# Patient Record
Sex: Female | Born: 1940 | Hispanic: Yes | Marital: Married | Smoking: Never smoker
Health system: Southern US, Community
[De-identification: ages and names within clinical notes are randomized; demographics above are authoritative.]

## PROBLEM LIST (undated history)

## (undated) DIAGNOSIS — E785 Hyperlipidemia, unspecified: Secondary | ICD-10-CM

## (undated) DIAGNOSIS — M810 Age-related osteoporosis without current pathological fracture: Secondary | ICD-10-CM

## (undated) DIAGNOSIS — I10 Essential (primary) hypertension: Secondary | ICD-10-CM

## (undated) DIAGNOSIS — E13319 Other specified diabetes mellitus with unspecified diabetic retinopathy without macular edema: Secondary | ICD-10-CM

## (undated) DIAGNOSIS — C801 Malignant (primary) neoplasm, unspecified: Secondary | ICD-10-CM

## (undated) HISTORY — DX: Hyperlipidemia, unspecified: E78.5

## (undated) HISTORY — DX: Other specified diabetes mellitus with unspecified diabetic retinopathy without macular edema: E13.319

## (undated) HISTORY — PX: BREAST LUMPECTOMY: SHX2

## (undated) HISTORY — PX: FRACTURE SURGERY: SHX138

## (undated) HISTORY — PX: BREAST SURGERY: SHX581

## (undated) HISTORY — PX: CYSTECTOMY: SUR359

## (undated) HISTORY — DX: Essential (primary) hypertension: I10

## (undated) HISTORY — DX: Malignant (primary) neoplasm, unspecified: C80.1

## (undated) HISTORY — DX: Age-related osteoporosis without current pathological fracture: M81.0

---

## 2007-11-06 ENCOUNTER — Ambulatory Visit: Payer: Self-pay | Admitting: Oncology

## 2007-11-19 ENCOUNTER — Encounter: Admission: RE | Admit: 2007-11-19 | Discharge: 2007-11-19 | Payer: Self-pay | Admitting: Family Medicine

## 2007-11-20 LAB — CBC WITH DIFFERENTIAL/PLATELET
BASO%: 0.5 % (ref 0.0–2.0)
Eosinophils Absolute: 0.1 10*3/uL (ref 0.0–0.5)
LYMPH%: 30.5 % (ref 14.0–48.0)
MCHC: 34.5 g/dL (ref 32.0–36.0)
MCV: 94.9 fL (ref 81.0–101.0)
MONO%: 5.4 % (ref 0.0–13.0)
Platelets: 223 10*3/uL (ref 145–400)
RBC: 4.03 10*6/uL (ref 3.70–5.32)

## 2007-11-20 LAB — COMPREHENSIVE METABOLIC PANEL
Alkaline Phosphatase: 70 U/L (ref 39–117)
Creatinine, Ser: 0.94 mg/dL (ref 0.40–1.20)
Glucose, Bld: 204 mg/dL — ABNORMAL HIGH (ref 70–99)
Sodium: 138 mEq/L (ref 135–145)
Total Bilirubin: 0.4 mg/dL (ref 0.3–1.2)
Total Protein: 7.8 g/dL (ref 6.0–8.3)

## 2007-12-05 ENCOUNTER — Encounter: Admission: RE | Admit: 2007-12-05 | Discharge: 2007-12-05 | Payer: Self-pay | Admitting: Oncology

## 2007-12-16 ENCOUNTER — Encounter: Admission: RE | Admit: 2007-12-16 | Discharge: 2007-12-16 | Payer: Self-pay | Admitting: General Surgery

## 2007-12-17 ENCOUNTER — Encounter: Admission: RE | Admit: 2007-12-17 | Discharge: 2007-12-17 | Payer: Self-pay | Admitting: General Surgery

## 2007-12-17 ENCOUNTER — Other Ambulatory Visit: Admission: RE | Admit: 2007-12-17 | Discharge: 2007-12-17 | Payer: Self-pay | Admitting: General Surgery

## 2007-12-17 ENCOUNTER — Encounter (INDEPENDENT_AMBULATORY_CARE_PROVIDER_SITE_OTHER): Payer: Self-pay | Admitting: General Surgery

## 2007-12-30 ENCOUNTER — Encounter: Admission: RE | Admit: 2007-12-30 | Discharge: 2007-12-30 | Payer: Self-pay | Admitting: Family Medicine

## 2008-01-07 ENCOUNTER — Encounter: Admission: RE | Admit: 2008-01-07 | Discharge: 2008-01-07 | Payer: Self-pay | Admitting: General Surgery

## 2008-01-16 ENCOUNTER — Encounter (INDEPENDENT_AMBULATORY_CARE_PROVIDER_SITE_OTHER): Payer: Self-pay | Admitting: General Surgery

## 2008-01-16 ENCOUNTER — Ambulatory Visit (HOSPITAL_COMMUNITY): Admission: RE | Admit: 2008-01-16 | Discharge: 2008-01-17 | Payer: Self-pay | Admitting: General Surgery

## 2008-01-26 ENCOUNTER — Ambulatory Visit: Payer: Self-pay | Admitting: Oncology

## 2008-01-27 ENCOUNTER — Ambulatory Visit: Admission: RE | Admit: 2008-01-27 | Discharge: 2008-04-18 | Payer: Self-pay | Admitting: Radiation Oncology

## 2008-03-09 ENCOUNTER — Encounter: Admission: RE | Admit: 2008-03-09 | Discharge: 2008-03-09 | Payer: Self-pay | Admitting: Oncology

## 2008-03-12 ENCOUNTER — Ambulatory Visit: Payer: Self-pay | Admitting: Oncology

## 2008-03-12 LAB — COMPREHENSIVE METABOLIC PANEL
ALT: 20 U/L (ref 0–35)
Alkaline Phosphatase: 86 U/L (ref 39–117)
Sodium: 135 mEq/L (ref 135–145)
Total Bilirubin: 0.5 mg/dL (ref 0.3–1.2)
Total Protein: 7.2 g/dL (ref 6.0–8.3)

## 2008-03-12 LAB — CBC WITH DIFFERENTIAL/PLATELET
EOS%: 2.4 % (ref 0.0–7.0)
MCH: 31.8 pg (ref 26.0–34.0)
MCV: 93.1 fL (ref 81.0–101.0)
MONO%: 7.8 % (ref 0.0–13.0)
NEUT#: 2.8 10*3/uL (ref 1.5–6.5)
RBC: 4.06 10*6/uL (ref 3.70–5.32)
RDW: 12.7 % (ref 11.3–14.5)

## 2008-03-12 LAB — LACTATE DEHYDROGENASE: LDH: 138 U/L (ref 94–250)

## 2008-07-22 ENCOUNTER — Ambulatory Visit: Payer: Self-pay | Admitting: Oncology

## 2008-11-04 ENCOUNTER — Ambulatory Visit: Payer: Self-pay | Admitting: Oncology

## 2008-11-12 ENCOUNTER — Ambulatory Visit (HOSPITAL_COMMUNITY): Admission: RE | Admit: 2008-11-12 | Discharge: 2008-11-12 | Payer: Self-pay | Admitting: Oncology

## 2008-11-12 LAB — CBC WITH DIFFERENTIAL/PLATELET
BASO%: 0 % (ref 0.0–2.0)
EOS%: 2.6 % (ref 0.0–7.0)
HCT: 36.8 % (ref 34.8–46.6)
MCH: 32.6 pg (ref 25.1–34.0)
MCHC: 34.7 g/dL (ref 31.5–36.0)
NEUT%: 74.2 % (ref 38.4–76.8)
RBC: 3.91 10*6/uL (ref 3.70–5.45)
RDW: 12.3 % (ref 11.2–14.5)
lymph#: 0.8 10*3/uL — ABNORMAL LOW (ref 0.9–3.3)

## 2008-11-12 LAB — COMPREHENSIVE METABOLIC PANEL
ALT: 18 U/L (ref 0–35)
AST: 20 U/L (ref 0–37)
Calcium: 8.9 mg/dL (ref 8.4–10.5)
Chloride: 100 mEq/L (ref 96–112)
Creatinine, Ser: 0.95 mg/dL (ref 0.40–1.20)

## 2008-12-06 ENCOUNTER — Encounter: Admission: RE | Admit: 2008-12-06 | Discharge: 2008-12-06 | Payer: Self-pay | Admitting: Radiation Oncology

## 2009-01-10 ENCOUNTER — Emergency Department (HOSPITAL_COMMUNITY): Admission: EM | Admit: 2009-01-10 | Discharge: 2009-01-10 | Payer: Self-pay | Admitting: Family Medicine

## 2009-02-10 ENCOUNTER — Ambulatory Visit: Payer: Self-pay | Admitting: Oncology

## 2009-02-22 LAB — CBC WITH DIFFERENTIAL/PLATELET
BASO%: 0.1 % (ref 0.0–2.0)
EOS%: 1.3 % (ref 0.0–7.0)
HCT: 39.5 % (ref 34.8–46.6)
LYMPH%: 33.2 % (ref 14.0–49.7)
MONO%: 5.9 % (ref 0.0–14.0)
NEUT#: 3 10*3/uL (ref 1.5–6.5)
NEUT%: 59.5 % (ref 38.4–76.8)
WBC: 5 10*3/uL (ref 3.9–10.3)
lymph#: 1.7 10*3/uL (ref 0.9–3.3)

## 2009-02-22 LAB — COMPREHENSIVE METABOLIC PANEL
ALT: 21 U/L (ref 0–35)
Albumin: 4.5 g/dL (ref 3.5–5.2)
CO2: 30 mEq/L (ref 19–32)
Calcium: 10 mg/dL (ref 8.4–10.5)
Chloride: 95 mEq/L — ABNORMAL LOW (ref 96–112)
Potassium: 3.5 mEq/L (ref 3.5–5.3)
Sodium: 137 mEq/L (ref 135–145)
Total Protein: 7.4 g/dL (ref 6.0–8.3)

## 2009-02-22 LAB — LACTATE DEHYDROGENASE: LDH: 136 U/L (ref 94–250)

## 2009-04-19 ENCOUNTER — Ambulatory Visit (HOSPITAL_BASED_OUTPATIENT_CLINIC_OR_DEPARTMENT_OTHER): Admission: RE | Admit: 2009-04-19 | Discharge: 2009-04-19 | Payer: Self-pay | Admitting: General Surgery

## 2009-06-16 ENCOUNTER — Ambulatory Visit: Payer: Self-pay | Admitting: Oncology

## 2009-06-20 LAB — CBC WITH DIFFERENTIAL/PLATELET
BASO%: 0.5 % (ref 0.0–2.0)
Basophils Absolute: 0 10*3/uL (ref 0.0–0.1)
EOS%: 2.2 % (ref 0.0–7.0)
HGB: 13.5 g/dL (ref 11.6–15.9)
LYMPH%: 34.3 % (ref 14.0–49.7)
MCH: 32.6 pg (ref 25.1–34.0)
MCV: 95.6 fL (ref 79.5–101.0)
MONO%: 5.2 % (ref 0.0–14.0)
NEUT%: 57.8 % (ref 38.4–76.8)
Platelets: 220 10*3/uL (ref 145–400)
WBC: 4.2 10*3/uL (ref 3.9–10.3)

## 2009-06-20 LAB — COMPREHENSIVE METABOLIC PANEL
ALT: 16 U/L (ref 0–35)
Alkaline Phosphatase: 56 U/L (ref 39–117)
BUN: 17 mg/dL (ref 6–23)
Calcium: 9.2 mg/dL (ref 8.4–10.5)
Creatinine, Ser: 0.91 mg/dL (ref 0.40–1.20)
Potassium: 3.9 mEq/L (ref 3.5–5.3)

## 2009-11-17 ENCOUNTER — Ambulatory Visit: Payer: Self-pay | Admitting: Oncology

## 2009-11-21 ENCOUNTER — Ambulatory Visit (HOSPITAL_COMMUNITY): Admission: RE | Admit: 2009-11-21 | Discharge: 2009-11-21 | Payer: Self-pay | Admitting: Oncology

## 2009-11-21 LAB — COMPREHENSIVE METABOLIC PANEL
ALT: 18 U/L (ref 0–35)
AST: 20 U/L (ref 0–37)
Alkaline Phosphatase: 58 U/L (ref 39–117)
Chloride: 98 mEq/L (ref 96–112)
Glucose, Bld: 190 mg/dL — ABNORMAL HIGH (ref 70–99)
Potassium: 3.8 mEq/L (ref 3.5–5.3)
Total Bilirubin: 0.4 mg/dL (ref 0.3–1.2)
Total Protein: 7.2 g/dL (ref 6.0–8.3)

## 2009-11-21 LAB — CBC WITH DIFFERENTIAL/PLATELET
BASO%: 0.4 % (ref 0.0–2.0)
HCT: 38.5 % (ref 34.8–46.6)
LYMPH%: 31 % (ref 14.0–49.7)
MCV: 93.7 fL (ref 79.5–101.0)
MONO#: 0.3 10*3/uL (ref 0.1–0.9)
RBC: 4.11 10*6/uL (ref 3.70–5.45)
WBC: 3.7 10*3/uL — ABNORMAL LOW (ref 3.9–10.3)
lymph#: 1.1 10*3/uL (ref 0.9–3.3)

## 2009-12-08 ENCOUNTER — Encounter: Admission: RE | Admit: 2009-12-08 | Discharge: 2009-12-08 | Payer: Self-pay | Admitting: Oncology

## 2010-03-13 ENCOUNTER — Ambulatory Visit: Payer: Self-pay | Admitting: Oncology

## 2010-03-14 LAB — COMPREHENSIVE METABOLIC PANEL
Alkaline Phosphatase: 72 U/L (ref 39–117)
Calcium: 9.7 mg/dL (ref 8.4–10.5)
Glucose, Bld: 276 mg/dL — ABNORMAL HIGH (ref 70–99)
Potassium: 4 mEq/L (ref 3.5–5.3)
Sodium: 140 mEq/L (ref 135–145)
Total Bilirubin: 0.3 mg/dL (ref 0.3–1.2)
Total Protein: 7.2 g/dL (ref 6.0–8.3)

## 2010-03-14 LAB — CBC WITH DIFFERENTIAL/PLATELET
Basophils Absolute: 0 10*3/uL (ref 0.0–0.1)
EOS%: 2.8 % (ref 0.0–7.0)
Eosinophils Absolute: 0.1 10*3/uL (ref 0.0–0.5)
HGB: 13.7 g/dL (ref 11.6–15.9)
MONO#: 0.3 10*3/uL (ref 0.1–0.9)
Platelets: 190 10*3/uL (ref 145–400)
RBC: 4.23 10*6/uL (ref 3.70–5.45)
lymph#: 1.3 10*3/uL (ref 0.9–3.3)

## 2010-03-14 LAB — LACTATE DEHYDROGENASE: LDH: 141 U/L (ref 94–250)

## 2010-04-16 ENCOUNTER — Encounter (HOSPITAL_COMMUNITY): Payer: Self-pay | Admitting: Oncology

## 2010-07-10 ENCOUNTER — Other Ambulatory Visit (HOSPITAL_COMMUNITY): Payer: Self-pay | Admitting: Oncology

## 2010-07-10 ENCOUNTER — Encounter (HOSPITAL_BASED_OUTPATIENT_CLINIC_OR_DEPARTMENT_OTHER): Payer: Medicare HMO | Admitting: Oncology

## 2010-07-10 DIAGNOSIS — C50619 Malignant neoplasm of axillary tail of unspecified female breast: Secondary | ICD-10-CM

## 2010-07-10 DIAGNOSIS — D0590 Unspecified type of carcinoma in situ of unspecified breast: Secondary | ICD-10-CM

## 2010-07-10 LAB — COMPREHENSIVE METABOLIC PANEL
ALT: 17 U/L (ref 0–35)
Albumin: 4 g/dL (ref 3.5–5.2)
BUN: 21 mg/dL (ref 6–23)
Chloride: 99 mEq/L (ref 96–112)
Creatinine, Ser: 0.9 mg/dL (ref 0.40–1.20)
Glucose, Bld: 199 mg/dL — ABNORMAL HIGH (ref 70–99)
Potassium: 4.1 mEq/L (ref 3.5–5.3)
Sodium: 138 mEq/L (ref 135–145)

## 2010-07-10 LAB — CBC WITH DIFFERENTIAL/PLATELET
Basophils Absolute: 0 10*3/uL (ref 0.0–0.1)
EOS%: 1.4 % (ref 0.0–7.0)
HCT: 37.4 % (ref 34.8–46.6)
LYMPH%: 28.9 % (ref 14.0–49.7)
MONO#: 0.3 10*3/uL (ref 0.1–0.9)
NEUT#: 2.9 10*3/uL (ref 1.5–6.5)
NEUT%: 62.9 % (ref 38.4–76.8)
RBC: 3.96 10*6/uL (ref 3.70–5.45)
WBC: 4.6 10*3/uL (ref 3.9–10.3)
lymph#: 1.3 10*3/uL (ref 0.9–3.3)

## 2010-08-08 NOTE — Op Note (Signed)
NAMESeveriano Campos                   ACCOUNT NO.:  [id number]   MEDICAL RECORD NO.:  [id number]          PATIENT TYPE:  OIB   LOCATION:  5125                         FACILITY:  [facility]   PHYSICIAN:  Ollen Gross. Vernell Morgans, M.D. DATE OF BIRTH:  Apr 08, 1940   DATE OF PROCEDURE:  01/16/2008  DATE OF DISCHARGE:                               OPERATIVE REPORT   PREOPERATIVE DIAGNOSIS:  Left breast cancer.   POSTOPERATIVE DIAGNOSIS:  Left breast cancer.   PROCEDURE:  Left axillary lymph node dissection and attempted sentinel  node mapping.   SURGEON:  Ollen Gross. Vernell Morgans, MD   ANESTHESIA:  General via LMA.   PROCEDURE IN DETAIL:  After informed consent was obtained, the patient  was brought to the operating room and placed in a supine position on the  operating room table.  After adequate induction of general anesthesia,  the patient's left breast, chest, and axilla were prepped with Betadine  and draped in usual sterile manner.  Earlier in the day, the patient had  undergone injection of 1 mCi of technetium sulfur colloid in the  subareolar position.  At this point, 2 mL of methylene blue and 3 mL of  injectable saline were also injected in the subareolar position.  The  breast was massaged for about 5 minutes.  A NeoProbe device was used to  look for radioactivity in the left axilla and no radioactivity could be  appreciated.  At this point, a #15 blade knife was used to make an  incision along her old lumpectomy incision and this was extended  laterally to the edge of the axilla.  This incision was carried down  through the skin and subcutaneous tissue sharply with the  electrocautery.  Once the axilla was entered, the axilla was examined  again with the NeoProbe and still no activity was identified.  The  patient therefore required an axillary lymph node dissection.  The  dissection was carried down to the chest wall, so that we were able to  identify the pectoralis muscle and chest wall.  This  was done medially.  Laterally, the dissection was carried down through the subcutaneous  tissue until the latissimus dorsi muscle was identified and then  superiorly, a combination of sharp Bovie dissection and blunt dissection  was carried out until we were able to identify the left axillary vein.  Once we had the boundaries of the axilla identified, the axillary  contents were separated from these boundaries by mostly blunt right  angle dissection.  Some lymphatics and a couple of vessels and the  intercostobrachial nerves were identified and controlled with vascular  clips and divided sharply with Metzenbaum scissors.  The long thoracic  and thoracodorsal nerve bundles were also identified and care was taken  to keep the dissection away from these structures.  Once we were able to  tease out the lymph node contents of the axilla and separating them from  all of these structures, then the axillary contents were removed and  sent to pathology for further evaluation.  The left axilla was then  irrigated with copious amounts of saline.  The operative bed appeared to  be hemostatic.  Small stab incision was made inferior to the operative  bed through the skin with a #15 blade knife.  A tonsil clamp was then  placed through this opening into the operative bed and used to bring a  19-French round Blake drain into the wound.  The drain was laid along  the chest wall up into the axilla.  The drain was anchored to the skin  with a 3-0 nylon stitch.  The wound was then infiltrated 0.25% Marcaine.  The deep layer of the wound was then closed with a running 3-0 Vicryl  stitch and the skin was closed with a running 4-0 Monocryl subcuticular  stitch.  Dermabond  dressing was applied as well as other sterile dressings.  The patient  tolerated the procedure well.  At the end the case, all needle, sponge,  and instrument counts were correct.  The drain was placed to bulb  suction.  The patient was then  awakened and taken to recovery in stable  condition.      Ollen Gross. Vernell Morgans, M.D.  Electronically Signed     PST/MEDQ  D:  01/16/2008  T:  01/17/2008  Job:  [id number]

## 2010-08-31 ENCOUNTER — Encounter (INDEPENDENT_AMBULATORY_CARE_PROVIDER_SITE_OTHER): Payer: Self-pay | Admitting: General Surgery

## 2010-10-30 ENCOUNTER — Other Ambulatory Visit (HOSPITAL_COMMUNITY): Payer: Self-pay | Admitting: Oncology

## 2010-10-30 ENCOUNTER — Other Ambulatory Visit (INDEPENDENT_AMBULATORY_CARE_PROVIDER_SITE_OTHER): Payer: Self-pay | Admitting: General Surgery

## 2010-10-30 DIAGNOSIS — Z853 Personal history of malignant neoplasm of breast: Secondary | ICD-10-CM

## 2010-11-16 ENCOUNTER — Encounter (INDEPENDENT_AMBULATORY_CARE_PROVIDER_SITE_OTHER): Payer: Self-pay | Admitting: General Surgery

## 2010-11-23 ENCOUNTER — Other Ambulatory Visit: Payer: Self-pay | Admitting: Family Medicine

## 2010-12-07 ENCOUNTER — Other Ambulatory Visit (HOSPITAL_COMMUNITY): Payer: Self-pay | Admitting: Oncology

## 2010-12-07 ENCOUNTER — Encounter (HOSPITAL_BASED_OUTPATIENT_CLINIC_OR_DEPARTMENT_OTHER): Payer: Medicare HMO | Admitting: Oncology

## 2010-12-07 DIAGNOSIS — C50619 Malignant neoplasm of axillary tail of unspecified female breast: Secondary | ICD-10-CM

## 2010-12-07 DIAGNOSIS — D0590 Unspecified type of carcinoma in situ of unspecified breast: Secondary | ICD-10-CM

## 2010-12-07 LAB — COMPREHENSIVE METABOLIC PANEL
ALT: 20 U/L (ref 0–35)
BUN: 19 mg/dL (ref 6–23)
CO2: 25 mEq/L (ref 19–32)
Calcium: 9.5 mg/dL (ref 8.4–10.5)
Chloride: 101 mEq/L (ref 96–112)
Creatinine, Ser: 0.95 mg/dL (ref 0.50–1.10)
Total Bilirubin: 0.3 mg/dL (ref 0.3–1.2)

## 2010-12-07 LAB — CBC WITH DIFFERENTIAL/PLATELET
BASO%: 0.4 % (ref 0.0–2.0)
Basophils Absolute: 0 10*3/uL (ref 0.0–0.1)
HCT: 37.7 % (ref 34.8–46.6)
HGB: 12.9 g/dL (ref 11.6–15.9)
LYMPH%: 30.5 % (ref 14.0–49.7)
MCH: 32.3 pg (ref 25.1–34.0)
MCHC: 34.2 g/dL (ref 31.5–36.0)
MONO#: 0.3 10*3/uL (ref 0.1–0.9)
NEUT%: 60.5 % (ref 38.4–76.8)
Platelets: 174 10*3/uL (ref 145–400)
lymph#: 1.2 10*3/uL (ref 0.9–3.3)

## 2010-12-07 LAB — LACTATE DEHYDROGENASE: LDH: 122 U/L (ref 94–250)

## 2010-12-15 ENCOUNTER — Ambulatory Visit
Admission: RE | Admit: 2010-12-15 | Discharge: 2010-12-15 | Disposition: A | Discharge status: Home / Self Care | Payer: Medicare HMO | Source: Ambulatory Visit | Attending: General Surgery | Admitting: General Surgery

## 2010-12-15 ENCOUNTER — Ambulatory Visit
Admission: RE | Admit: 2010-12-15 | Discharge: 2010-12-15 | Disposition: A | Discharge status: Home / Self Care | Payer: Medicare HMO | Source: Ambulatory Visit | Attending: Family Medicine | Admitting: Family Medicine

## 2010-12-15 DIAGNOSIS — Z853 Personal history of malignant neoplasm of breast: Secondary | ICD-10-CM

## 2010-12-26 LAB — GLUCOSE, CAPILLARY
Glucose-Capillary: 196 — ABNORMAL HIGH
Glucose-Capillary: 228 — ABNORMAL HIGH
Glucose-Capillary: 245 — ABNORMAL HIGH
Glucose-Capillary: 253 — ABNORMAL HIGH
Glucose-Capillary: 284 — ABNORMAL HIGH

## 2010-12-26 LAB — BASIC METABOLIC PANEL
BUN: 11
Chloride: 98
GFR calc Af Amer: >60
GFR calc non Af Amer: >60
Potassium: 4.3

## 2010-12-26 LAB — CBC
Platelets: 207
RBC: 4.15
WBC: 5.9

## 2011-01-15 ENCOUNTER — Encounter (INDEPENDENT_AMBULATORY_CARE_PROVIDER_SITE_OTHER): Payer: Self-pay | Admitting: General Surgery

## 2011-02-19 ENCOUNTER — Ambulatory Visit (INDEPENDENT_AMBULATORY_CARE_PROVIDER_SITE_OTHER): Payer: Medicare HMO | Admitting: General Surgery

## 2011-02-19 ENCOUNTER — Encounter (INDEPENDENT_AMBULATORY_CARE_PROVIDER_SITE_OTHER): Payer: Self-pay | Admitting: General Surgery

## 2011-02-19 VITALS — BP 138/68 | HR 72 | Temp 97.2°F | Resp 12 | Ht 60.0 in | Wt 136.6 lb

## 2011-02-19 DIAGNOSIS — C50919 Malignant neoplasm of unspecified site of unspecified female breast: Secondary | ICD-10-CM

## 2011-02-19 DIAGNOSIS — Z853 Personal history of malignant neoplasm of breast: Secondary | ICD-10-CM | POA: Insufficient documentation

## 2011-02-19 NOTE — Progress Notes (Signed)
Subjective:     Patient ID: Erika Campos, female   DOB: 03-16-1941, 70 y.o.   MRN: [id number]  HPI The patient is a 70 year old Hispanic female who is now 3 years out from a left breast lumpectomy and axillary node dissection for a T1 N0 left breast cancer. She was ER PR positive and HER-2/neu negative. She is taking tamoxifen and tolerating it well. She only has a few hot flashes. Since her last visit she has developed a cold but she is recovering from this now. She denies any pain in the breast. She denies any discharge or nipple.  Review of Systems  Constitutional: Negative.   HENT: Negative.   Eyes: Negative.   Respiratory: Negative.   Cardiovascular: Negative.   Gastrointestinal: Negative.   Genitourinary: Negative.   Musculoskeletal: Negative.   Skin: Negative.   Neurological: Negative.   Hematological: Negative.   Psychiatric/Behavioral: Negative.        Objective:   Physical Exam  Constitutional: She is oriented to person, place, and time. She appears well-developed and well-nourished.  HENT:  Head: Normocephalic and atraumatic.  Eyes: Conjunctivae and EOM are normal. Pupils are equal, round, and reactive to light.  Neck: Normal range of motion. Neck supple.  Cardiovascular: Normal rate, regular rhythm and normal heart sounds.   Pulmonary/Chest: Effort normal and breath sounds normal.       No palpable mass in either breast. No axillary supraclavicular or cervical lymphadenopathy.  Abdominal: Soft. Bowel sounds are normal. She exhibits no mass. There is no tenderness.  Musculoskeletal: Normal range of motion.  Lymphadenopathy:    She has no cervical adenopathy.  Neurological: She is alert and oriented to person, place, and time.  Skin: Skin is warm and dry.  Psychiatric: She has a normal mood and affect. Her behavior is normal.       Assessment:     3 years status post left lumpectomy and axillary node dissection    Plan:     At this point she will continue  tamoxifen and regular self exams. Her latest mammogram was performed one month ago and was negative. We'll plan to see her back in about 6 months.

## 2011-02-19 NOTE — Patient Instructions (Signed)
Continue regular self exams Continue tamoxifen 

## 2011-05-28 ENCOUNTER — Telehealth: Payer: Self-pay | Admitting: Oncology

## 2011-05-28 NOTE — Telephone Encounter (Signed)
S/w pt husband re appt for 3/28. Schedule also mailed.

## 2011-06-21 ENCOUNTER — Telehealth: Payer: Self-pay | Admitting: Oncology

## 2011-06-21 ENCOUNTER — Other Ambulatory Visit (HOSPITAL_BASED_OUTPATIENT_CLINIC_OR_DEPARTMENT_OTHER): Payer: Medicare Other | Admitting: Lab

## 2011-06-21 ENCOUNTER — Ambulatory Visit (HOSPITAL_COMMUNITY)
Admission: RE | Admit: 2011-06-21 | Discharge: 2011-06-21 | Disposition: A | Discharge status: Home / Self Care | Payer: Medicare Other | Source: Ambulatory Visit | Attending: Physician Assistant | Admitting: Physician Assistant

## 2011-06-21 ENCOUNTER — Ambulatory Visit (HOSPITAL_BASED_OUTPATIENT_CLINIC_OR_DEPARTMENT_OTHER): Payer: Medicare Other | Admitting: Physician Assistant

## 2011-06-21 VITALS — BP 146/72 | HR 76 | Temp 97.7°F | Ht 60.0 in | Wt 134.9 lb

## 2011-06-21 DIAGNOSIS — D0590 Unspecified type of carcinoma in situ of unspecified breast: Secondary | ICD-10-CM

## 2011-06-21 DIAGNOSIS — C50919 Malignant neoplasm of unspecified site of unspecified female breast: Secondary | ICD-10-CM

## 2011-06-21 DIAGNOSIS — E119 Type 2 diabetes mellitus without complications: Secondary | ICD-10-CM | POA: Insufficient documentation

## 2011-06-21 DIAGNOSIS — C50619 Malignant neoplasm of axillary tail of unspecified female breast: Secondary | ICD-10-CM

## 2011-06-21 LAB — CBC WITH DIFFERENTIAL/PLATELET
Basophils Absolute: 0 10*3/uL (ref 0.0–0.1)
Eosinophils Absolute: 0.1 10*3/uL (ref 0.0–0.5)
HGB: 13.1 g/dL (ref 11.6–15.9)
MCV: 95.1 fL (ref 79.5–101.0)
MONO#: 0.2 10*3/uL (ref 0.1–0.9)
NEUT#: 2.7 10*3/uL (ref 1.5–6.5)
Platelets: 159 10*3/uL (ref 145–400)
RBC: 4.12 10*6/uL (ref 3.70–5.45)
RDW: 12.6 % (ref 11.2–14.5)
WBC: 4.3 10*3/uL (ref 3.9–10.3)

## 2011-06-21 LAB — LACTATE DEHYDROGENASE: LDH: 132 U/L (ref 94–250)

## 2011-06-21 LAB — COMPREHENSIVE METABOLIC PANEL
Albumin: 4 g/dL (ref 3.5–5.2)
BUN: 18 mg/dL (ref 6–23)
CO2: 29 mEq/L (ref 19–32)
Calcium: 9.5 mg/dL (ref 8.4–10.5)
Glucose, Bld: 223 mg/dL — ABNORMAL HIGH (ref 70–99)
Potassium: 3.9 mEq/L (ref 3.5–5.3)
Sodium: 137 mEq/L (ref 135–145)
Total Protein: 7 g/dL (ref 6.0–8.3)

## 2011-06-21 NOTE — Progress Notes (Signed)
[facility] OFFICE PROGRESS NOTE  Charlesetta Shanks, MD, MD   INTERIM HISTORY: I saw Erika Campos today for followup of her 3.0 mm invasive cancer involving the left breast after the patient was diagnosed in the fall of 2009.  She underwent partial mastectomy and axillary dissection.  All 15 lymph nodes were negative.  Surgery was carried out on 01/16/2008.  The patient received radiation, 6000 cGy in 30 fractions, from 02/11/2008 through 03/29/2008.  The patient has been on adjuvant tamoxifen 20 mg daily since mid January 2010.  She denies any vaginal bleeding.  She has occasional hot flashes.  She is here today with her husband, Erika Campos. The patient apparently has not had a pelvic exam since January 2011 by Dr. Celene Skeen.SHe is due for another one soon.  Her last mammogram was on September 21,2012, negative for malignancy.   Her last chest x-ray was a year and half ago. The patient had a bone density scan on 12/15/2011 remarkable for increased risk of fracture on the Left femur, now with increase  3.8 percent risk at 0.625 g/cm2 . SHe has been placed on Fosamax 70 mg a week. She has been seen by her primary care physician, Dr. Carolynne Edouard and his PA, Elpidio Anis  for evaluation of irregular pulse.She has had adjustment of medication with good results. Her pulse is now regular.  She feels generally well without any complaints.  There are certainly no symptoms to suggest recurrent breast cancer.  Medicines are reviewed and recorded.  As stated, she is on tamoxifen 20 mg daily.  She says she is being compliant with her medicine.  MEDICAL HISTORY: Past Medical History  Diagnosis Date  . Hypertension   . Diabetes mellitus   . Hyperlipidemia     SURGICAL HISTORY:  Past Surgical History  Procedure Date  . Cystectomy     lt breast    MEDICATIONS: Current Outpatient Prescriptions  Medication Sig Dispense Refill  . alendronate (FOSAMAX) 70 MG tablet Take 70 mg by mouth every 7 (seven) days. Take with a full  glass of water on an empty stomach.      Marland Kitchen amLODipine (NORVASC) 5 MG tablet Take 5 mg by mouth daily.      . Calcium Carbonate-Vitamin D (CALCIUM-VITAMIN D) 500-200 MG-UNIT per tablet Take 1 tablet by mouth every morning.      Marland Kitchen glipiZIDE (GLUCOTROL) 5 MG tablet Take 5 mg by mouth 2 (two) times daily before a meal.        . hydrochlorothiazide (HYDRODIURIL) 25 MG tablet Take 25 mg by mouth daily.      Marland Kitchen linagliptin (TRADJENTA) 5 MG TABS tablet Take 5 mg by mouth daily.      Marland Kitchen losartan (COZAAR) 50 MG tablet Take 50 mg by mouth daily.      . metFORMIN (GLUCOPHAGE) 1000 MG tablet Take 1,000 mg by mouth 2 (two) times daily with a meal.      . Multiple Vitamin (MULTIVITAMIN) capsule Take 1 capsule by mouth daily.        . rosuvastatin (CRESTOR) 10 MG tablet Take 10 mg by mouth daily.        . tamoxifen (NOLVADEX) 20 MG tablet Take 20 mg by mouth 2 (two) times daily.          ALLERGIES:  is allergic to adrenalin.  REVIEW OF SYSTEMS:  The rest of the 14-point review of system was negative.   Filed Vitals:   06/21/11 1049  BP: 146/72  Pulse:  76  Temp: 97.7 F (36.5 C)   Wt Readings from Last 3 Encounters:  06/21/11 134 lb 14.4 oz (61.19 kg)  02/19/11 136 lb 9.6 oz (61.961 kg)   ECOG Performance status:   PHYSICAL EXAMINATION: .  HEENT:  There is no scleral icterus.  Mouth and pharynx are benign.  Nodes:  No peripheral adenopathy palpable.  Lungs:  Clear.  Cardiac Exam:  No murmur. Pulse is regular.   Breasts:  Quite firm with good symmetry.  No dimpling, nipple inversion.  Her surgical scar in the upper outer quadrant is well healed.  Back:  No skeletal tenderness.  Abdomen:  Benign with no organomegaly or masses palpable.  Extremities:  No peripheral edema or clubbing.  The patient has some very mild lymphedema of the left lower arm; I had not noted that previously.     LABORATORY/RADIOLOGY DATA:   Lab 06/21/11 0938  WBC 4.3  HGB 13.1  HCT 39.2  PLT 159  MCV 95.1  MCH 31.8  MCHC  33.4  RDW 12.6  LYMPHSABS 1.3  MONOABS 0.2  EOSABS 0.1  BASOSABS 0.0  BANDABS --    CMP   No results found for this basename: NA:5,K:5,CL:5,CO2:5,GLUCOSE:5,BUN:5,CREATININE:5,GFRCGP,:5,CALCIUM:5,MG:5,AST:5,ALT:5,ALKPHOS:5,BILITOT:5 in the last 168 hours      Component Value Date/Time   BILITOT 0.3 12/07/2010 0920     Radiology Studies:  No results found.     ASSESSMENT AND PLAN:   [name] is now 3 years out from the time of diagnosis of her very small invasive cancer involving the left breast.  She has been on tamoxifen since April 13, 2008, and will need to complete a total of 5 years.  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  She received Evista for that at the [facility] in [place].  She did not receive radiation treatments.   We will plan to see [name] again in 6 months, at which time she can see Dr. Arline Asp.  We will check CBC and chemistries.  She will be having mammograms as scheduled. .  We will also try to notify the office of her primary care physician with this information.  Will order a CXR today for follow up, which results will be reviewed by Dr. Arline Asp. SHe knows to call us in the interim if she has any questions or concerns.

## 2011-06-21 NOTE — Telephone Encounter (Signed)
sent pt to [facility] for CXR. Gv pt appt for sept2013

## 2011-08-21 ENCOUNTER — Encounter (INDEPENDENT_AMBULATORY_CARE_PROVIDER_SITE_OTHER): Payer: Self-pay | Admitting: General Surgery

## 2011-08-21 ENCOUNTER — Ambulatory Visit (INDEPENDENT_AMBULATORY_CARE_PROVIDER_SITE_OTHER): Payer: Medicare Other | Admitting: General Surgery

## 2011-08-21 VITALS — BP 152/74 | HR 60 | Temp 97.6°F | Resp 12 | Ht 60.0 in | Wt 134.2 lb

## 2011-08-21 DIAGNOSIS — C50919 Malignant neoplasm of unspecified site of unspecified female breast: Secondary | ICD-10-CM

## 2011-08-21 NOTE — Patient Instructions (Signed)
Continue regular self exams Continue tamoxifen 

## 2011-08-27 NOTE — Progress Notes (Signed)
Subjective:     Patient ID: Erika Campos, female   DOB: 1940-06-14, 71 y.o.   MRN: [id number]  HPI The patient is a 71 year old white female who is 3-1/2 years status post left breast lumpectomy and axillary lymph node dissection for a T1 N0 left breast cancer. Since her last visit she has done well. She is taking tamoxifen and tolerating it well. She denies any breast pain or discharge from her nipple  Review of Systems  Constitutional: Negative.   HENT: Negative.   Eyes: Negative.   Respiratory: Negative.   Cardiovascular: Negative.   Gastrointestinal: Negative.   Genitourinary: Negative.   Musculoskeletal: Negative.   Skin: Negative.   Neurological: Negative.   Hematological: Negative.   Psychiatric/Behavioral: Negative.        Objective:   Physical Exam  Constitutional: She is oriented to person, place, and time. She appears well-developed and well-nourished.  HENT:  Head: Normocephalic and atraumatic.  Eyes: Conjunctivae and EOM are normal. Pupils are equal, round, and reactive to light.  Neck: Normal range of motion. Neck supple.  Cardiovascular: Normal rate, regular rhythm and normal heart sounds.   Pulmonary/Chest: Effort normal and breath sounds normal.  Abdominal: Soft. Bowel sounds are normal. She exhibits no mass. There is no tenderness.  Musculoskeletal: Normal range of motion.  Lymphadenopathy:    She has no cervical adenopathy.  Neurological: She is alert and oriented to person, place, and time.  Skin: Skin is warm and dry.  Psychiatric: She has a normal mood and affect. Her behavior is normal.       Assessment:     3-1/2 years status post left breast lumpectomy and axillary lymph node dissection    Plan:     At this point she will continue to take her tamoxifen daily. She will continue to do regular self exams. We will plan to see her back in about 6 months.

## 2011-11-05 ENCOUNTER — Other Ambulatory Visit (INDEPENDENT_AMBULATORY_CARE_PROVIDER_SITE_OTHER): Payer: Self-pay | Admitting: General Surgery

## 2011-11-05 DIAGNOSIS — Z9889 Other specified postprocedural states: Secondary | ICD-10-CM

## 2011-11-05 DIAGNOSIS — Z853 Personal history of malignant neoplasm of breast: Secondary | ICD-10-CM

## 2011-12-18 ENCOUNTER — Ambulatory Visit
Admission: RE | Admit: 2011-12-18 | Discharge: 2011-12-18 | Disposition: A | Discharge status: Home / Self Care | Payer: Medicare Other | Source: Ambulatory Visit | Attending: General Surgery | Admitting: General Surgery

## 2011-12-18 ENCOUNTER — Telehealth (INDEPENDENT_AMBULATORY_CARE_PROVIDER_SITE_OTHER): Payer: Self-pay

## 2011-12-18 DIAGNOSIS — Z9889 Other specified postprocedural states: Secondary | ICD-10-CM

## 2011-12-18 DIAGNOSIS — Z853 Personal history of malignant neoplasm of breast: Secondary | ICD-10-CM

## 2011-12-18 NOTE — Telephone Encounter (Signed)
Called pt's daughter to notify her that the mgm looked negative per Dr Carolynne Edouard.

## 2011-12-20 ENCOUNTER — Telehealth: Payer: Self-pay | Admitting: Oncology

## 2011-12-20 ENCOUNTER — Encounter: Payer: Self-pay | Admitting: Oncology

## 2011-12-20 ENCOUNTER — Other Ambulatory Visit (HOSPITAL_BASED_OUTPATIENT_CLINIC_OR_DEPARTMENT_OTHER): Payer: Medicare Other

## 2011-12-20 ENCOUNTER — Ambulatory Visit (HOSPITAL_BASED_OUTPATIENT_CLINIC_OR_DEPARTMENT_OTHER): Payer: Medicare Other | Admitting: Oncology

## 2011-12-20 VITALS — BP 139/78 | HR 66 | Temp 97.8°F | Resp 20 | Ht 60.0 in | Wt 132.1 lb

## 2011-12-20 DIAGNOSIS — M810 Age-related osteoporosis without current pathological fracture: Secondary | ICD-10-CM

## 2011-12-20 DIAGNOSIS — C50919 Malignant neoplasm of unspecified site of unspecified female breast: Secondary | ICD-10-CM

## 2011-12-20 DIAGNOSIS — Z87898 Personal history of other specified conditions: Secondary | ICD-10-CM

## 2011-12-20 DIAGNOSIS — I890 Lymphedema, not elsewhere classified: Secondary | ICD-10-CM

## 2011-12-20 LAB — CBC WITH DIFFERENTIAL/PLATELET
BASO%: 0.3 % (ref 0.0–2.0)
LYMPH%: 29.8 % (ref 14.0–49.7)
MCHC: 33.5 g/dL (ref 31.5–36.0)
MONO#: 0.3 10*3/uL (ref 0.1–0.9)
NEUT#: 2.6 10*3/uL (ref 1.5–6.5)
Platelets: 162 10*3/uL (ref 145–400)
RBC: 4.1 10*6/uL (ref 3.70–5.45)
RDW: 12.6 % (ref 11.2–14.5)
WBC: 4.3 10*3/uL (ref 3.9–10.3)
lymph#: 1.3 10*3/uL (ref 0.9–3.3)

## 2011-12-20 LAB — LACTATE DEHYDROGENASE (CC13): LDH: 155 U/L (ref 125–220)

## 2011-12-20 LAB — COMPREHENSIVE METABOLIC PANEL (CC13)
ALT: 23 U/L (ref 0–55)
Albumin: 3.6 g/dL (ref 3.5–5.0)
CO2: 26 mEq/L (ref 22–29)
Potassium: 3.9 mEq/L (ref 3.5–5.1)
Sodium: 137 mEq/L (ref 136–145)
Total Bilirubin: 0.6 mg/dL (ref 0.20–1.20)
Total Protein: 6.9 g/dL (ref 6.4–8.3)

## 2011-12-20 NOTE — Progress Notes (Signed)
CC:   Erika Campos, M.D. Elpidio Anis, PA-C Ollen Gross. Vernell Morgans, M.D. Lurline Hare, M.D.  PROBLEM LIST: 1. A 3 mm invasive cancer involving the left breast, grade 1, status     post partial mastectomy on 12/17/2007.  Estrogen receptor was 84%,     progesterone receptor 95%.  HER-2/neu was negative by [facility].     Proliferative fraction was 9%.  Left axillary lymph node dissection     revealed that all 15 lymph nodes were negative.  Tumor stage was     T1a N0.  Axillary dissection was carried out on 01/16/2008.  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  The patient received radiation treatments     to the left breast. 6000 cGy in 30 fractions from 02/11/2008     through 03/29/2008.  The patient is now on adjuvant tamoxifen since     April 13, 2008. 2. Diabetes mellitus type 2, diagnosed in the early mid 1990s. 3. Hypertension, diagnosed about 2000. 4. Dyslipidemia. 5. Osteoporosis. 6. Mild lymphedema of the left arm following axillary dissection and     radiation treatments. 7. History of an irregular pulse. 8. History of bladder polyps, status post cystoscopy by a urologist in     [facility] in late August 2013.  The polyps apparently were benign.  MEDICATIONS: 1. Fosamax 70 mg weekly. 2. Norvasc 5 mg daily. 3. Aspirin 81 mg daily. 4. Calcium carbonate/vitamin D 500/200 daily. 5. Glucotrol 5 mg twice daily. 6. Hydrochlorothiazide 25 mg daily. 7. Cozaar 50 mg daily. 8. Metformin 1000 mg twice daily. 9. Multivitamins. 10.Zocor 20 mg every evening. 11.Tamoxifen 20 mg daily.  SMOKING HISTORY:  The patient has never smoked cigarettes.  HISTORY:  Erika Campos is seen today for followup of her small low-grade invasive cancer involving the left breast after the diagnosis was made in the  fall of 2009.  As noted above, the patient underwent partial mastectomy and axillary lymph node dissection.  All 15 lymph nodes were negative.  The patient received radiation treatments and then has been on adjuvant tamoxifen since mid January 2010.  She says she is compliant with her tamoxifen.  It will be recalled that the diagnosis of an invasive breast cancer was preceded by DCIS, treated with lumpectomy in [place] in March 2007.  The patient did not receive radiation at that time and was on Evista for 1 year and 3 months until  she developed an invasive cancer of the left breast.  The patient was last seen by [place] on 06/21/2011 and prior to that on 12/07/2010.  The patient occasionally has hot flashes.  She denies any vaginal bleeding.  She does retain her uterus.  She apparently developed some hematuria and underwent cystoscopy about 3 weeks ago in [facility]. She had polyps removed.  She has had no subsequent bleeding.  Polyps apparently were benign.  The patient feels generally well and is without any problems or symptomatology.  She is here today with her husband, Erika Campos.  PHYSICAL EXAMINATION:  The patient looks well.  Weight is 132.1 pounds. Height 5 feet even, body surface area 1.59 sq m.  Blood pressure 139/78. Other vital signs are normal.  There is no scleral icterus.  Mouth and pharynx are benign.  Facial features are somewhat weathered.  No adenopathy in the neck.  Heart and lungs are normal.  Rhythm is regular. Breasts are firm with fairly good symmetry and no suspicious findings. Surgical scars are well healed.  No axillary adenopathy.  Back:  No skeletal tenderness.  Abdomen:  Benign with no organomegaly or masses palpable.  Extremities:  No peripheral edema or clubbing.  The patient has very mild lymphedema of the left lower arm.  Neurologic:  Exam is normal.  LABORATORY:  Today white count 4.3, ANC. 2.6.  Hemoglobin 13.1, hematocrit 39.0, platelets 162,000.   Chemistries from today were notable for a glucose of 320, otherwise normal.  Glucose on 06/21/2011 was 223, otherwise normal.  IMAGING STUDIES: 1. Bone density scan on 01-10-11 showed a T-score of the lumbar     spine of -0.28 and a T-score of the left femoral neck of -2.0.     Lumbar spine bone mineral density has not significantly changed     since the study carried out on 03/09/2008.  The bone mineral     density of the left femur has increased by 3.8%, which is     considered statistically significant. 2. Chest x-ray, 2 view, showed some enlargement of the cardiac     silhouette and a tortuous aorta.  Surgical clips were seen in the     left axilla.  Bones appear diffusely demineralized. 3. Digital diagnostic bilateral mammograms on 12/18/2011 were     negative.  IMPRESSION AND PLAN:  [name] continues to do well, now 4 years out from the diagnosis of her invasive small low-grade cancer that occurred following conservative treatment of her DCIS involving the left breast. The DCIS dates back to March 2007.  The patient is doing well at this time.  She will continue on tamoxifen until January 2015.  I should mention that the patient states that she did undergo a pelvic exam when she had the cystoscopy in August of this year.  The patient had a colonoscopy carried out in [place] in 2006 that apparently was negative.  We will plan to see the patient again in 6 months at which time we will check CBC and chemistries.    ______________________________ Samul Dada, M.D. DSM/MEDQ  D:  12/20/2011  T:  12/20/2011  Job:  [id number]

## 2011-12-20 NOTE — Telephone Encounter (Signed)
Gave pt appt for MArch 2014 lab and MD °

## 2011-12-20 NOTE — Progress Notes (Signed)
This office note has been dictated.  #[id number]

## 2012-04-28 ENCOUNTER — Encounter (INDEPENDENT_AMBULATORY_CARE_PROVIDER_SITE_OTHER): Payer: Self-pay | Admitting: General Surgery

## 2012-04-28 ENCOUNTER — Ambulatory Visit (INDEPENDENT_AMBULATORY_CARE_PROVIDER_SITE_OTHER): Payer: Medicare Other | Admitting: General Surgery

## 2012-04-28 VITALS — BP 124/70 | HR 69 | Temp 97.6°F | Resp 18 | Ht 60.0 in | Wt 125.8 lb

## 2012-04-28 DIAGNOSIS — C50919 Malignant neoplasm of unspecified site of unspecified female breast: Secondary | ICD-10-CM

## 2012-04-28 NOTE — Patient Instructions (Signed)
Continue tamoxifen Continue regular self exams Use hydrocortisone cream twice a day for 2 weeks for rash on chest

## 2012-05-21 ENCOUNTER — Encounter (INDEPENDENT_AMBULATORY_CARE_PROVIDER_SITE_OTHER): Payer: Self-pay | Admitting: General Surgery

## 2012-05-21 NOTE — Progress Notes (Signed)
Subjective:     Patient ID: Erika Campos, female   DOB: 1940/07/17, 72 y.o.   MRN: [id number]  HPI The patient is a 72 year old white female who is 4 years status post left breast lumpectomy and axillary lymph node dissection for a T1 N0 left breast cancer. She continues to take tamoxifen and is tolerating this well. Other than some soreness of the left breast she has no other complaints. She does note a rash that has developed on her chest wall. She does not report any new detergents or contact with anything out of the ordinary  Review of Systems  Constitutional: Negative.   HENT: Negative.   Eyes: Negative.   Respiratory: Negative.   Cardiovascular: Negative.   Gastrointestinal: Negative.   Endocrine: Negative.   Genitourinary: Negative.   Musculoskeletal: Negative.   Skin: Negative.   Allergic/Immunologic: Negative.   Neurological: Negative.   Hematological: Negative.   Psychiatric/Behavioral: Negative.        Objective:   Physical Exam  Constitutional: She is oriented to person, place, and time. She appears well-developed and well-nourished.  HENT:  Head: Normocephalic and atraumatic.  Eyes: Conjunctivae and EOM are normal. Pupils are equal, round, and reactive to light.  Neck: Normal range of motion. Neck supple.  Cardiovascular: Normal rate, regular rhythm and normal heart sounds.   Pulmonary/Chest: Effort normal and breath sounds normal.  There is no palpable mass in either breast. There is no palpable axillary supraclavicular or cervical lymphadenopathy.  Abdominal: Soft. Bowel sounds are normal. She exhibits no mass. There is no tenderness.  Musculoskeletal: Normal range of motion.  Lymphadenopathy:    She has no cervical adenopathy.  Neurological: She is alert and oriented to person, place, and time.  Skin: Skin is warm and dry.  Psychiatric: She has a normal mood and affect. Her behavior is normal.       Assessment:     The patient is 4 years status post left  breast lumpectomy and axillary lymph node dissection for breast cancer     Plan:     At this point she will continue to take tamoxifen. She will continue to do regular self exams. We will plan to see her back in about 1 year. I've also recommended use of hydrocortisone cream to rash on her chest.

## 2012-05-26 ENCOUNTER — Telehealth: Payer: Self-pay | Admitting: Oncology

## 2012-05-26 NOTE — Telephone Encounter (Signed)
s.w. pt and advised on d/t appt change...pt ok and aware

## 2012-06-19 ENCOUNTER — Ambulatory Visit: Payer: Medicare Other | Admitting: Oncology

## 2012-06-19 ENCOUNTER — Other Ambulatory Visit: Payer: Medicare Other | Admitting: Lab

## 2012-06-24 ENCOUNTER — Other Ambulatory Visit: Payer: Self-pay | Admitting: Lab

## 2012-06-24 ENCOUNTER — Ambulatory Visit: Payer: Self-pay | Admitting: Physician Assistant

## 2012-06-25 ENCOUNTER — Telehealth: Payer: Self-pay | Admitting: Oncology

## 2012-06-27 ENCOUNTER — Ambulatory Visit (HOSPITAL_BASED_OUTPATIENT_CLINIC_OR_DEPARTMENT_OTHER): Payer: Medicare Other | Admitting: Physician Assistant

## 2012-06-27 ENCOUNTER — Telehealth: Payer: Self-pay | Admitting: Oncology

## 2012-06-27 ENCOUNTER — Other Ambulatory Visit (HOSPITAL_BASED_OUTPATIENT_CLINIC_OR_DEPARTMENT_OTHER): Payer: Medicare Other | Admitting: Lab

## 2012-06-27 VITALS — BP 153/80 | HR 68 | Temp 97.4°F | Resp 18 | Ht 60.0 in | Wt 126.8 lb

## 2012-06-27 DIAGNOSIS — D0590 Unspecified type of carcinoma in situ of unspecified breast: Secondary | ICD-10-CM

## 2012-06-27 DIAGNOSIS — C50919 Malignant neoplasm of unspecified site of unspecified female breast: Secondary | ICD-10-CM

## 2012-06-27 LAB — CBC WITH DIFFERENTIAL/PLATELET
BASO%: 0.4 % (ref 0.0–2.0)
EOS%: 1.4 % (ref 0.0–7.0)
HCT: 39.3 % (ref 34.8–46.6)
LYMPH%: 25.8 % (ref 14.0–49.7)
MCH: 30.4 pg (ref 25.1–34.0)
MCHC: 32.6 g/dL (ref 31.5–36.0)
NEUT%: 66.1 % (ref 38.4–76.8)
Platelets: 183 10*3/uL (ref 145–400)
RBC: 4.2 10*6/uL (ref 3.70–5.45)
lymph#: 2 10*3/uL (ref 0.9–3.3)

## 2012-06-27 LAB — COMPREHENSIVE METABOLIC PANEL (CC13)
ALT: 23 U/L (ref 0–55)
AST: 20 U/L (ref 5–34)
Creatinine: 0.8 mg/dL (ref 0.6–1.1)
Total Bilirubin: 0.55 mg/dL (ref 0.20–1.20)

## 2012-06-27 NOTE — Patient Instructions (Signed)
Follow up in 6 months withlabs and make sure you have a mammogram in September so that we can evaluate it.

## 2012-06-27 NOTE — Progress Notes (Signed)
[facility]  [phone]    OFFICE PROGRESS NOTE CC: Tracey Harries, M.D.  Elpidio Anis, PA-C  Ollen Gross. Vernell Morgans, M.D.  Lurline Hare, M.D.  PROBLEM LIST:  1. 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 Estrogen receptor was 84%,  progesterone receptor 95%. HER-2/neu was negative by CISH. Proliferative fraction was 9%. Left axillary lymph node dissection revealed that all 15 lymph nodes were negative. Tumor stage was T1a N0. Axillary dissection was carried out on 01/16/2008. 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 The patient received radiation treatments to the left breast. 6000 cGy in 30 fractions from 02/11/2008 through 03/29/2008. The patient is now on adjuvant tamoxifen since April 13, 2008.  2. Diabetes mellitus type 2, diagnosed in the early mid 1990s.  3. Hypertension, diagnosed about 2000.  4. Dyslipidemia.  5. Osteoporosis.  6. Mild lymphedema of the left arm following axillary dissection and radiation treatments.  7. History of an irregular pulse.  8. History of bladder polyps, status post cystoscopy by a urologist in [facility] in late August 2013. The polyps apparently were benign.     MEDICATIONS:  Current Outpatient Prescriptions  Medication Sig Dispense Refill  . alendronate (FOSAMAX) 70 MG tablet Take 70 mg by mouth every 7 (seven) days. Take with a full glass of water on an empty stomach.      Marland Kitchen amLODipine (NORVASC) 5 MG tablet Take 5 mg by mouth daily.      Marland Kitchen aspirin 81 MG tablet Take 81 mg by mouth daily.      . Calcium Carbonate-Vitamin D (CALCIUM-VITAMIN D) 500-200 MG-UNIT per tablet Take 1 tablet by mouth every morning.      Marland Kitchen glipiZIDE (GLUCOTROL) 5 MG tablet Take 5 mg by mouth 2 (two) times  daily before a meal.        . hydrochlorothiazide (HYDRODIURIL) 25 MG tablet Take 25 mg by mouth daily.      Marland Kitchen losartan (COZAAR) 50 MG tablet Take 50 mg by mouth daily.      . metFORMIN (GLUCOPHAGE) 1000 MG tablet Take 1,000 mg by mouth 2 (two) times daily with a meal.      . Multiple Vitamin (MULTIVITAMIN) capsule Take 1 capsule by mouth daily.        . simvastatin (ZOCOR) 20 MG tablet Take 20 mg by mouth every evening.      . tamoxifen (NOLVADEX) 20 MG tablet Take 20 mg by mouth 2 (two) times daily.        . TRADJENTA 5 MG TABS tablet        No current facility-administered medications for this visit.    ALLERGIES:   Allergies  Allergen Reactions  . Epinephrine Hcl     Patient unsure of reaction to this medication.   SMOKING HISTORY: The patient has never smoked cigarettes.  HISTORY: Erika Campos is seen today for followup of her small low-grade invasive cancer involving the left breast after the diagnosis was made  in the fall of 2009. As noted above, the patient underwent partial mastectomy and axillary lymph node dissection. All 15 lymph nodes were  negative. The patient received radiation treatments and then has been on adjuvant tamoxifen since mid January 2010. She says she is compliant  with her  tamoxifen. It will be recalled that the diagnosis of an invasive breast cancer was preceded by DCIS, treated with lumpectomy in  [place] in March 2007. The patient did not receive radiation at that time and was on Evista for 1 year and 3 months until she developed an  invasive cancer of the left breast. The patient was last seen by [place] on 06/21/2011 and prior to that on 12/07/2010.She has been seen by Dr. Carolynne Edouard in February 2014, with negative findings. She is to see him again in 1 year. The patient occasionally has hot flashes. She denies any vaginal bleeding. She does retain her uterus. She apparently developed some hematuria and underwent cystoscopy in 10/2011 in [facility]. She had polyps  removed. She has had no subsequent bleeding. Polyps apparently were benign. The patient feels generally well and is without any problems or symptomatology. She is here today with her husband, Erika Campos.   PHYSICAL EXAMINATION:   Filed Vitals:   06/27/12 1050  BP: 153/80  Pulse: 68  Temp: 97.4 F (36.3 C)  Resp: 18   Filed Weights   06/27/12 1050  Weight: 126 lb 12.8 oz (57.516 kg)    The patient looks well.. Other vital signs are normal. There is no scleral icterus. Mouth and pharynx are benign. Facial features are somewhat weathered. No adenopathy in the neck. Heart and lungs are normal. Rhythm is regular. Breasts are firm with fairly good symmetry and no suspicious findings. Surgical scars are well healed. No axillary adenopathy. Back: No skeletal tenderness. Abdomen: Benign with no organomegaly or masses palpable. Extremities: No peripheral edema or clubbing. The patient has very mild lymphedema of the left lower arm. Neurologic: Exam is normal.  LABORATORY/RADIOLOGY DATA:   Recent Labs Lab 06/27/12 0951  WBC 7.7  HGB 12.8  HCT 39.3  PLT 183  MCV 93.5  MCH 30.4  MCHC 32.6  RDW 12.4  LYMPHSABS 2.0  MONOABS 0.5  EOSABS 0.1  BASOSABS 0.0    CMP    Recent Labs Lab 06/27/12 0951  NA 138  K 4.0  CL 101  CO2 26  GLUCOSE 148*  BUN 14.7  CREATININE 0.8  CALCIUM 9.3  AST 20  ALT 23  ALKPHOS 72  BILITOT 0.55     LABORATORY: On 12/20/11 white count 4.3, ANC. 2.6. Hemoglobin 13.1, hematocrit 39.0, platelets 162,000. Chemistries were notable for a glucose of 320, otherwise normal.  IMAGING STUDIES:   1. Bone density scan on 12/15/2010 showed a T-score of the lumbar spine of -0.28 and a T-score of the left femoral neck of -2.0. Lumbar spine bone mineral density has not significantly changed since the study carried out on 03/09/2008. The bone mineral density of the left femur has increased by 3.8%, which is considered statistically significant.  2. Chest x-ray, 2 view,  on 06/21/11 showed some enlargement of the cardiac silhouette and a tortuous aorta. Surgical clips were seen in the left axilla. Bones appear diffusely demineralized.  3. Digital diagnostic bilateral mammograms on 12/18/2011 were negative.   ASSESSMENT AND PLAN:  Erika Campos continues to do well, now 4 1/2  years out from the diagnosis of her invasive small low-grade cancer that occurred following conservative treatment of her DCIS involving the left breast. The DCIS dates back to March 2007. The patient is doing well at this time. She will continue on tamoxifen until January 2015.The patient had a colonoscopy carried out in [place] in 2006 that apparently was negative. We will plan to see the  patient again in 6 months at which time we will check CBC and chemistries.No CXR will be ordered during this visit.   [facility], PA-C 06/27/2012, 11:50 AM

## 2012-06-27 NOTE — Telephone Encounter (Signed)
gv and printed pt appt schedule for NOV...pt did not want to come in OCT

## 2012-11-17 ENCOUNTER — Other Ambulatory Visit: Payer: Self-pay | Admitting: Physician Assistant

## 2012-11-17 DIAGNOSIS — Z853 Personal history of malignant neoplasm of breast: Secondary | ICD-10-CM

## 2012-12-19 ENCOUNTER — Ambulatory Visit
Admission: RE | Admit: 2012-12-19 | Discharge: 2012-12-19 | Disposition: A | Discharge status: Home / Self Care | Payer: Medicare Other | Source: Ambulatory Visit | Attending: Physician Assistant | Admitting: Physician Assistant

## 2012-12-19 DIAGNOSIS — Z853 Personal history of malignant neoplasm of breast: Secondary | ICD-10-CM

## 2013-01-29 ENCOUNTER — Other Ambulatory Visit: Payer: Self-pay

## 2013-01-29 DIAGNOSIS — C50919 Malignant neoplasm of unspecified site of unspecified female breast: Secondary | ICD-10-CM

## 2013-01-30 ENCOUNTER — Telehealth: Payer: Self-pay | Admitting: Internal Medicine

## 2013-01-30 ENCOUNTER — Encounter (INDEPENDENT_AMBULATORY_CARE_PROVIDER_SITE_OTHER): Payer: Self-pay

## 2013-01-30 ENCOUNTER — Other Ambulatory Visit (HOSPITAL_BASED_OUTPATIENT_CLINIC_OR_DEPARTMENT_OTHER): Payer: Medicare Other | Admitting: Lab

## 2013-01-30 ENCOUNTER — Ambulatory Visit (HOSPITAL_BASED_OUTPATIENT_CLINIC_OR_DEPARTMENT_OTHER): Payer: Medicare Other | Admitting: Internal Medicine

## 2013-01-30 VITALS — BP 128/68 | HR 70 | Temp 97.7°F | Resp 18 | Ht 60.0 in | Wt 128.3 lb

## 2013-01-30 DIAGNOSIS — C50919 Malignant neoplasm of unspecified site of unspecified female breast: Secondary | ICD-10-CM

## 2013-01-30 DIAGNOSIS — M810 Age-related osteoporosis without current pathological fracture: Secondary | ICD-10-CM

## 2013-01-30 DIAGNOSIS — D0590 Unspecified type of carcinoma in situ of unspecified breast: Secondary | ICD-10-CM

## 2013-01-30 LAB — COMPREHENSIVE METABOLIC PANEL (CC13)
AST: 24 U/L (ref 5–34)
Anion Gap: 15 mEq/L — ABNORMAL HIGH (ref 3–11)
BUN: 24.8 mg/dL (ref 7.0–26.0)
CO2: 27 mEq/L (ref 22–29)
Calcium: 10.4 mg/dL (ref 8.4–10.4)
Chloride: 98 mEq/L (ref 98–109)
Creatinine: 0.9 mg/dL (ref 0.6–1.1)
Glucose: 184 mg/dl — ABNORMAL HIGH (ref 70–140)

## 2013-01-30 LAB — CBC WITH DIFFERENTIAL/PLATELET
Basophils Absolute: 0 10*3/uL (ref 0.0–0.1)
EOS%: 1.7 % (ref 0.0–7.0)
Eosinophils Absolute: 0.1 10*3/uL (ref 0.0–0.5)
HCT: 42.3 % (ref 34.8–46.6)
HGB: 14.1 g/dL (ref 11.6–15.9)
MCH: 31.2 pg (ref 25.1–34.0)
NEUT%: 58.7 % (ref 38.4–76.8)
lymph#: 1.8 10*3/uL (ref 0.9–3.3)

## 2013-01-30 LAB — LACTATE DEHYDROGENASE (CC13): LDH: 166 U/L (ref 125–245)

## 2013-01-30 NOTE — Telephone Encounter (Signed)
gv adn printed appt sched and avs for pt for May 2015 °

## 2013-01-30 NOTE — Progress Notes (Signed)
[facility] OFFICE PROGRESS NOTE  Charlesetta Shanks, MD [phone] [place] [address]  DIAGNOSIS: Breast cancer, unspecified laterality - Plan: CBC with Differential, Comprehensive metabolic panel  Chief Complaint  Patient presents with  . Breast cancer, unspecified laterality    CURRENT THERAPY: tamoxifen 20 mg daily since 04/13/2008.   INTERVAL HISTORY: Erika Campos 72 y.o. female with a history of left breast cancer is here for follow-up.  She was last seen by Jerelyn Charles on 06/27/2012.   She is accompanied by her daughter Zeta today.  She reports that her weight is stable.  She denies bone pain.  She had her flu shot last month.  Overall, she reports doing well.  Her last mammogram was this fall and reported to be without evidence of disease.    MEDICAL HISTORY: Past Medical History  Diagnosis Date  . Hypertension   . Diabetes mellitus   . Hyperlipidemia     INTERIM HISTORY: has Breast cancer on her problem list.    ALLERGIES:  is allergic to epinephrine hcl.  MEDICATIONS: has a current medication list which includes the following prescription(s): alendronate, amlodipine, aspirin, calcium-vitamin d, canagliflozin, glipizide, hydrochlorothiazide, losartan, metformin, multivitamin, simvastatin, tamoxifen, and tradjenta.  SURGICAL HISTORY:  Past Surgical History  Procedure Laterality Date  . Cystectomy      lt breast   PROBLEM LIST:  1. 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 Estrogen receptor was 84%, progesterone receptor 95%. HER-2/neu was negative by CISH. Proliferative fraction was 9%. Left axillary lymph node dissection revealed that all 15 lymph nodes were negative. Tumor stage was T1a N0. Axillary dissection was carried out on 01/16/2008. 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 The patient received radiation treatments to the left breast. 6000 cGy in 30 fractions from 02/11/2008 through 03/29/2008. The patient is now on adjuvant tamoxifen since April 13, 2008.  2. Diabetes mellitus type 2, diagnosed in the early mid 1990s.  3. Hypertension, diagnosed about 2000.  4. Dyslipidemia.  5. Osteoporosis.  6. Mild lymphedema of the left arm following axillary dissection and radiation treatments.  7. History of an irregular pulse.  8. History of bladder polyps, status post cystoscopy by a urologist in [facility] in late August 2013. The polyps apparently were benign.   REVIEW OF SYSTEMS:   Constitutional: Denies fevers, chills or abnormal weight loss Eyes: Denies blurriness of vision Ears, nose, mouth, throat, and face: Denies mucositis or sore throat Respiratory: Denies cough, dyspnea or wheezes Cardiovascular: Denies palpitation, chest discomfort or lower extremity swelling Gastrointestinal:  Denies nausea, heartburn or change in bowel habits Skin: Denies abnormal skin rashes Lymphatics: Denies new lymphadenopathy or easy bruising Neurological:Denies numbness, tingling or new weaknesses Behavioral/Psych: Mood is stable, no new changes  All other systems were reviewed with the patient and are negative.  PHYSICAL EXAMINATION: ECOG PERFORMANCE STATUS: 0 - Asymptomatic  Blood pressure 128/68, pulse 70, temperature 97.7 F (36.5 C), temperature source Oral, resp. rate 18, height 5' (1.524 m), weight 128 lb 4.8 oz (58.196 kg), SpO2 98.00%.  GENERAL:alert, no distress and comfortable: elderly female who appears her stated age.  SKIN: skin color, texture, turgor are normal, no rashes or significant lesions EYES: normal, Conjunctiva are pink and non-injected, sclera clear OROPHARYNX:no  exudate, no erythema and lips, buccal mucosa, and tongue normal  NECK: supple, thyroid normal size,  non-tender, without nodularity LYMPH:  no palpable lymphadenopathy in the cervical, axillary or supraclavicular LUNGS: clear to auscultation and percussion with normal breathing effort HEART: regular rate & rhythm and no murmurs and no lower extremity edema ABDOMEN:abdomen soft, non-tender and normal bowel sounds BREASTS: Right breast without masses or nipple discharge; L breast with scarring beneath armpits s/p lymph node removals, breast without masses or nipple discharge or retraction.   Musculoskeletal:no cyanosis of digits and no clubbing  NEURO: alert & oriented x 3 with fluent speech, no focal motor/sensory deficits   LABORATORY DATA: Results for orders placed in visit on 01/30/13 (from the past 48 hour(s))  CBC WITH DIFFERENTIAL     Status: None   Collection Time    01/30/13  9:19 AM      Result Value Range   WBC 5.6  3.9 - 10.3 10e3/uL   NEUT# 3.3  1.5 - 6.5 10e3/uL   HGB 14.1  11.6 - 15.9 g/dL   HCT 16.1  09.6 - 04.5 %   Platelets 192  145 - 400 10e3/uL   MCV 93.8  79.5 - 101.0 fL   MCH 31.2  25.1 - 34.0 pg   MCHC 33.2  31.5 - 36.0 g/dL   RBC 4.09  8.11 - 9.14 10e6/uL   RDW 12.6  11.2 - 14.5 %   lymph# 1.8  0.9 - 3.3 10e3/uL   MONO# 0.4  0.1 - 0.9 10e3/uL   Eosinophils Absolute 0.1  0.0 - 0.5 10e3/uL   Basophils Absolute 0.0  0.0 - 0.1 10e3/uL   NEUT% 58.7  38.4 - 76.8 %   LYMPH% 31.9  14.0 - 49.7 %   MONO% 7.0  0.0 - 14.0 %   EOS% 1.7  0.0 - 7.0 %   BASO% 0.7  0.0 - 2.0 %  COMPREHENSIVE METABOLIC PANEL (CC13)     Status: Abnormal   Collection Time    01/30/13  9:19 AM      Result Value Range   Sodium 140  136 - 145 mEq/L   Potassium 4.2  3.5 - 5.1 mEq/L   Chloride 98  98 - 109 mEq/L   CO2 27  22 - 29 mEq/L   Glucose 184 (*) 70 - 140 mg/dl   BUN 78.2  7.0 - 95.6 mg/dL   Creatinine 0.9  0.6 - 1.1 mg/dL   Total Bilirubin 2.13  0.20 - 1.20 mg/dL   Alkaline Phosphatase 69  40 - 150 U/L   AST 24  5 - 34 U/L   ALT 24  0 - 55 U/L   Total Protein 8.0  6.4 - 8.3  g/dL   Albumin 3.9  3.5 - 5.0 g/dL   Calcium 08.6  8.4 - 57.8 mg/dL   Anion Gap 15 (*) 3 - 11 mEq/L  LACTATE DEHYDROGENASE (CC13)     Status: None   Collection Time    01/30/13  9:19 AM      Result Value Range   LDH 166  125 - 245 U/L       Labs:  Lab Results  Component Value Date   WBC 5.6 01/30/2013   HGB 14.1 01/30/2013   HCT 42.3 01/30/2013   MCV 93.8 01/30/2013   PLT 192 01/30/2013   NEUTROABS 3.3 01/30/2013      Chemistry      Component Value Date/Time   NA 140 01/30/2013  0919   NA 137 06/21/2011 0938   K 4.2 01/30/2013 0919   K 3.9 06/21/2011 0938   CL 101 06/27/2012 0951   CL 98 06/21/2011 0938   CO2 27 01/30/2013 0919   CO2 29 06/21/2011 0938   BUN 24.8 01/30/2013 0919   BUN 18 06/21/2011 0938   CREATININE 0.9 01/30/2013 0919   CREATININE 0.88 06/21/2011 0938      Component Value Date/Time   CALCIUM 10.4 01/30/2013 0919   CALCIUM 9.5 06/21/2011 0938   ALKPHOS 69 01/30/2013 0919   ALKPHOS 74 06/21/2011 0938   AST 24 01/30/2013 0919   AST 21 06/21/2011 0938   ALT 24 01/30/2013 0919   ALT 21 06/21/2011 0938   BILITOT 0.49 01/30/2013 0919   BILITOT 0.4 06/21/2011 0938     Basic Metabolic Panel:  Recent Labs Lab 01/30/13 0919  NA 140  K 4.2  CO2 27  GLUCOSE 184*  BUN 24.8  CREATININE 0.9  CALCIUM 10.4   GFR Estimated Creatinine Clearance: 44.5 ml/min (by C-G formula based on Cr of 0.9). Liver Function Tests:  Recent Labs Lab 01/30/13 0919  AST 24  ALT 24  ALKPHOS 69  BILITOT 0.49  PROT 8.0  ALBUMIN 3.9   CBC:  Recent Labs Lab 01/30/13 0919  WBC 5.6  NEUTROABS 3.3  HGB 14.1  HCT 42.3  MCV 93.8  PLT 192   Studies:  Clinical Data: Follow-up bone mineral density.  DUAL X-RAY ABSORPTIOMETRY (DXA) FOR BONE MINERAL DENSITY  AP LUMBAR SPINE L1, L2, L4.  Bone Mineral Density (BMD): 0.725 g/cm2  Young Adult T Score: -2.8  Z Score: -0.60  LEFT FEMUR NECK  Bone Mineral Density (BMD): 0.625 g/cm2  Young Adult T Score: -2.0  Z Score: -0.3  ASSESSMENT:  Patient's diagnostic category is OSTEOPOROSIS by WHO  Criteria.  FRACTURE RISK: INCREASED  FRAX: World Health Organization FRAX assessment of absolute  fracture risk is not calculated for this patient because the  patient has osteoporosis.  The lumbar spine bone mineral density has not significantly changed  since 2009. The bone mineral density of the left femur has  increased by 3.8%, considered statistically significant.  Comparison: 03/09/2008  RECOMMENDATIONS:  Effective therapies are available in the form of bisphosphonates,  selective estrogen receptor modulators, biologic agents, and  hormone replacement therapy (for women). All patients should  ensure an adequate intake of dietary calcium (1200mg  daily) and  vitamin D (800 IU daily) unless contraindicated.  All treatment decisions require clinical judgement and  consideration of individual patient factors, including patient  preferences, co-morbidities, previous drug use, risk factors not  captured in the FRAX model (e.g., frailty, falls, vitamin D  deficiency, increased bone turnover, interval significant decline  in bone density) and possible under-or over-estimation of fracture  risk by FRAX.  The National Osteoporosis Foundation recommends that FDA-approved  medical therapies be considered in postmenopausal women and mean  age 75 or older with a:  1) Hip or vertebral (clinical or morphometric) fracture.  2) T-score of -2.5 or lower at the spine or hip.  3) Ten-year fracture probability by FRAX of 3% or greater for  hip fracture or 20% or greater for major osteoporotic fracture.  FOLLOW-UP:  People with diagnosed cases of osteoporosis or at high risk for  fracture should have regular bone mineral density tests. For  patients eligible for Medicare, routine testing is allowed once  every 2 years. The testing frequency can be increased to one year  for  patients who have rapidly progressing disease, those who are  receiving  or discontinuing medical therapy to restore bone mass, or  have additional risk factors.  World Science writer [facility]) Criteria:  Normal: T scores from +1.0 to -1.0  Low Bone Mass (Osteopenia): T scores between -1.0 and -2.5  Osteoporosis: T scores -2.5 and below  Comparison to Reference Population:  T score is the key measure used in the diagnosis of osteoporosis  and relative risk determination for fracture. It provides a value  for bone mass relative to the mean bone mass of a young adult  reference population expressed in terms of standard deviation (SD).  Z score is the age-matched score showing the patient's values  compared to a population matched for age, sex, and race. This is  also expressed in terms of standard deviation. The patient may  have values that compare favorably to the age-matched values and  still be at increased risk for fracture.   RADIOGRAPHIC STUDIES: CLINICAL DATA: 71 year old female with history of left breast  cancer post lumpectomy in 2009 followed by radiation.  EXAM:  DIGITAL DIAGNOSTIC BILATERAL MAMMOGRAM  COMPARISON: Previous exams.  ACR Breast Density Category c: The breast tissue is heterogeneously  dense.  FINDINGS:  There are no suspicious masses or calcifications seen in either  breast. At the surgical site in the left breast there is density and  architectural distortion consistent with a postlumpectomy scar. Spot  compression magnification view of the lumpectomy site was performed.  There is no evidence of locally recurrent disease at the lumpectomy  site.  Mammographic images were processed with CAD.  IMPRESSION:  Stable post lumpectomy changes in the left breast. No mammographic  evidence of malignancy in either breast.  RECOMMENDATION:  Screening mammogram in one year.(Code:SM-B-01Y)  I have discussed the findings and recommendations with the patient.  Results were also provided in writing at the conclusion of the  visit. If  applicable, a reminder letter will be sent to the patient  regarding the next appointment.  BI-RADS CATEGORY 1: Negative    ASSESSMENT: Erika Campos 72 y.o. female with a history of Breast cancer, unspecified laterality - Plan: CBC with Differential, Comprehensive metabolic panel   PLAN:  1. Breast cancer, left.  --Erika Campos continues to do well, now almost 5 years out from the diagnosis of her invasive small low-grade cancer that occurred following conservative treatment of her DCIS involving the left breast.  The DCIS dates back to March 2007. The patient is doing well at this time. She will continue on tamoxifen until January 2015.  Mammogram annually.  Last one as noted above.   2. Cancer screening.  She undergo a pelvic exam when she had the cystoscopy in August of this year. The patient had a  colonoscopy carried out in [place] in 2006 that apparently was negative.   3. Osteoporosis --Continue Alendronate, calcium carbonate-Vitamin D.  Repeat bone density next visit.   4. Follow-up. -- We will plan to see the patient again in 6 months at which time we will check CBC and chemistries.  All questions were answered. The patient knows to call the clinic with any problems, questions or concerns. We can certainly see the patient much sooner if necessary.  I spent 10 minutes counseling the patient face to face. The total time spent in the appointment was 15 minutes.    Carisha Kantor, MD 01/30/2013 10:38 AM

## 2013-01-30 NOTE — Patient Instructions (Signed)
1. Stop tamoxifen on January 19 th 2015.   Tamoxifen oral tablet What is this medicine? TAMOXIFEN (ta MOX i fen) blocks the effects of estrogen. It is commonly used to treat breast cancer. It is also used to decrease the chance of breast cancer coming back in women who have received treatment for the disease. It may also help prevent breast cancer in women who have a high risk of developing breast cancer. This medicine may be used for other purposes; ask your health care provider or pharmacist if you have questions. COMMON BRAND NAME(S): Nolvadex What should I tell my health care provider before I take this medicine? They need to know if you have any of these conditions: -blood clots -blood disease -cataracts or impaired eyesight -endometriosis -high calcium levels -high cholesterol -irregular menstrual cycles -liver disease -stroke -uterine fibroids -an unusual or allergic reaction to tamoxifen, other medicines, foods, dyes, or preservatives -pregnant or trying to get pregnant -breast-feeding How should I use this medicine? Take this medicine by mouth with a glass of water. Follow the directions on the prescription label. You can take it with or without food. Take your medicine at regular intervals. Do not take your medicine more often than directed. Do not stop taking except on your doctor's advice. A special MedGuide will be given to you by the pharmacist with each prescription and refill. Be sure to read this information carefully each time. Talk to your pediatrician regarding the use of this medicine in children. While this drug may be prescribed for selected conditions, precautions do apply. Overdosage: If you think you have taken too much of this medicine contact a poison control center or emergency room at once. NOTE: This medicine is only for you. Do not share this medicine with others. What if I miss a dose? If you miss a dose, take it as soon as you can. If it is almost time  for your next dose, take only that dose. Do not take double or extra doses. What may interact with this medicine? -aminoglutethimide -bromocriptine -chemotherapy drugs -female hormones, like estrogens and birth control pills -letrozole -medroxyprogesterone -phenobarbital -rifampin -warfarin This list may not describe all possible interactions. Give your health care provider a list of all the medicines, herbs, non-prescription drugs, or dietary supplements you use. Also tell them if you smoke, drink alcohol, or use illegal drugs. Some items may interact with your medicine. What should I watch for while using this medicine? Visit your doctor or health care professional for regular checks on your progress. You will need regular pelvic exams, breast exams, and mammograms. If you are taking this medicine to reduce your risk of getting breast cancer, you should know that this medicine does not prevent all types of breast cancer. If breast cancer or other problems occur, there is no guarantee that it will be found at an early stage. Do not become pregnant while taking this medicine or for 2 months after stopping this medicine. Stop taking this medicine if you get pregnant or think you are pregnant and contact your doctor. This medicine may harm your unborn baby. Women who can possibly become pregnant should use birth control methods that do not use hormones during tamoxifen treatment and for 2 months after therapy has stopped. Talk with your health care provider for birth control advice. Do not breast feed while taking this medicine. What side effects may I notice from receiving this medicine? Side effects that you should report to your doctor or health care professional  as soon as possible: -changes in vision (blurred vision) -changes in your menstrual cycle -difficulty breathing or shortness of breath -difficulty walking or talking -new breast lumps -numbness -pelvic pain or pressure -redness,  blistering, peeling or loosening of the skin, including inside the mouth -skin rash or itching (hives) -sudden chest pain -swelling of lips, face, or tongue -swelling, pain or tenderness in your calf or leg -unusual bruising or bleeding -vaginal discharge that is bloody, brown, or rust -weakness -yellowing of the whites of the eyes or skin Side effects that usually do not require medical attention (report to your doctor or health care professional if they continue or are bothersome): -fatigue -hair loss, although uncommon and is usually mild -headache -hot flashes -impotence (in men) -nausea, vomiting (mild) -vaginal discharge (white or clear) This list may not describe all possible side effects. Call your doctor for medical advice about side effects. You may report side effects to FDA at 1-800-FDA-1088. Where should I keep my medicine? Keep out of the reach of children. Store at room temperature between 20 and 25 degrees C (68 and 77 degrees F). Protect from light. Keep container tightly closed. Throw away any unused medicine after the expiration date. NOTE: This sheet is a summary. It may not cover all possible information. If you have questions about this medicine, talk to your doctor, pharmacist, or health care provider.  2014, Elsevier/Gold Standard. (2007-11-27 12:01:56)

## 2013-05-12 ENCOUNTER — Ambulatory Visit (INDEPENDENT_AMBULATORY_CARE_PROVIDER_SITE_OTHER): Payer: Medicare Other | Admitting: General Surgery

## 2013-05-26 ENCOUNTER — Encounter (INDEPENDENT_AMBULATORY_CARE_PROVIDER_SITE_OTHER): Payer: Self-pay | Admitting: General Surgery

## 2013-05-26 ENCOUNTER — Ambulatory Visit (INDEPENDENT_AMBULATORY_CARE_PROVIDER_SITE_OTHER): Payer: Medicare HMO | Admitting: General Surgery

## 2013-05-26 VITALS — BP 128/64 | HR 68 | Temp 98.6°F | Resp 14 | Ht 60.0 in | Wt 124.8 lb

## 2013-05-26 DIAGNOSIS — C50919 Malignant neoplasm of unspecified site of unspecified female breast: Secondary | ICD-10-CM

## 2013-05-26 NOTE — Patient Instructions (Signed)
Continue tamoxifen until you meet with medical oncology Continue regular self exams

## 2013-05-26 NOTE — Progress Notes (Signed)
Subjective:     Patient ID: Erika Campos, female   DOB: Oct 13, 1940, 73 y.o.   MRN: [id number]  HPI The patient is a 73 year old white female who is 5 years status post left breast lumpectomy and axillary lymph node dissection for a T1 N0 left breast cancer. She continues to take tamoxifen and is tolerating this well. She has no complaints of pain today. She has had no discharge from her nipple. Her last mammogram was in September that showed no evidence of malignancy.  Review of Systems  Constitutional: Negative.   HENT: Negative.   Eyes: Negative.   Respiratory: Negative.   Cardiovascular: Negative.   Gastrointestinal: Negative.   Endocrine: Negative.   Genitourinary: Negative.   Musculoskeletal: Negative.   Skin: Negative.   Allergic/Immunologic: Negative.   Neurological: Negative.   Hematological: Negative.   Psychiatric/Behavioral: Negative.        Objective:   Physical Exam  Constitutional: She is oriented to person, place, and time. She appears well-developed and well-nourished.  HENT:  Head: Normocephalic and atraumatic.  Eyes: Conjunctivae and EOM are normal. Pupils are equal, round, and reactive to light.  Neck: Normal range of motion. Neck supple.  Cardiovascular: Normal rate, regular rhythm and normal heart sounds.   Pulmonary/Chest: Effort normal and breath sounds normal.  She has a very dense symmetric breast tissue bilaterally. There is no discrete palpable mass in either breast. There is no palpable axillary, supraclavicular, or cervical lymphadenopathy. There is no swelling of the left arm  Abdominal: Soft. Bowel sounds are normal.  Musculoskeletal: Normal range of motion.  Lymphadenopathy:    She has no cervical adenopathy.  Neurological: She is alert and oriented to person, place, and time.  Skin: Skin is warm and dry.  Psychiatric: She has a normal mood and affect. Her behavior is normal.       Assessment:     The patient is 5 years status post left  breast lumpectomy and axillary lymph node dissection for breast cancer in     Plan:     At this point she will continue tamoxifen until she meets with the medical oncologist. She will continue to do regular self exams. I will plan to see her back in one year.

## 2013-07-31 ENCOUNTER — Telehealth: Payer: Self-pay | Admitting: Internal Medicine

## 2013-07-31 ENCOUNTER — Other Ambulatory Visit (HOSPITAL_BASED_OUTPATIENT_CLINIC_OR_DEPARTMENT_OTHER): Payer: Commercial Managed Care - HMO

## 2013-07-31 ENCOUNTER — Ambulatory Visit (HOSPITAL_BASED_OUTPATIENT_CLINIC_OR_DEPARTMENT_OTHER): Payer: Medicare HMO | Admitting: Internal Medicine

## 2013-07-31 VITALS — BP 124/81 | HR 72 | Temp 98.0°F | Resp 18 | Ht 60.0 in | Wt 124.0 lb

## 2013-07-31 DIAGNOSIS — Z853 Personal history of malignant neoplasm of breast: Secondary | ICD-10-CM

## 2013-07-31 DIAGNOSIS — M810 Age-related osteoporosis without current pathological fracture: Secondary | ICD-10-CM

## 2013-07-31 DIAGNOSIS — C50919 Malignant neoplasm of unspecified site of unspecified female breast: Secondary | ICD-10-CM

## 2013-07-31 LAB — COMPREHENSIVE METABOLIC PANEL (CC13)
ALK PHOS: 68 U/L (ref 40–150)
ALT: 19 U/L (ref 0–55)
AST: 22 U/L (ref 5–34)
Albumin: 3.7 g/dL (ref 3.5–5.0)
Anion Gap: 15 mEq/L — ABNORMAL HIGH (ref 3–11)
BUN: 21.8 mg/dL (ref 7.0–26.0)
CALCIUM: 9.9 mg/dL (ref 8.4–10.4)
CHLORIDE: 100 meq/L (ref 98–109)
CO2: 23 mEq/L (ref 22–29)
Creatinine: 1.1 mg/dL (ref 0.6–1.1)
Glucose: 191 mg/dl — ABNORMAL HIGH (ref 70–140)
Potassium: 3.9 mEq/L (ref 3.5–5.1)
Sodium: 137 mEq/L (ref 136–145)
Total Bilirubin: 0.62 mg/dL (ref 0.20–1.20)
Total Protein: 7.4 g/dL (ref 6.4–8.3)

## 2013-07-31 LAB — CBC WITH DIFFERENTIAL/PLATELET
BASO%: 0.5 % (ref 0.0–2.0)
BASOS ABS: 0 10*3/uL (ref 0.0–0.1)
EOS%: 3.3 % (ref 0.0–7.0)
Eosinophils Absolute: 0.2 10*3/uL (ref 0.0–0.5)
HEMATOCRIT: 43.3 % (ref 34.8–46.6)
HGB: 14.4 g/dL (ref 11.6–15.9)
LYMPH%: 28.1 % (ref 14.0–49.7)
MCH: 31.7 pg (ref 25.1–34.0)
MCHC: 33.4 g/dL (ref 31.5–36.0)
MCV: 95.1 fL (ref 79.5–101.0)
MONO#: 0.4 10*3/uL (ref 0.1–0.9)
MONO%: 7.3 % (ref 0.0–14.0)
NEUT#: 3.4 10*3/uL (ref 1.5–6.5)
NEUT%: 60.8 % (ref 38.4–76.8)
Platelets: 182 10*3/uL (ref 145–400)
RBC: 4.55 10*6/uL (ref 3.70–5.45)
RDW: 12.8 % (ref 11.2–14.5)
WBC: 5.5 10*3/uL (ref 3.9–10.3)
lymph#: 1.6 10*3/uL (ref 0.9–3.3)

## 2013-07-31 NOTE — Progress Notes (Signed)
[facility] OFFICE PROGRESS NOTE  Bartholome Bill, MD [address] [place] [address]  DIAGNOSIS: Breast cancer - Plan: CBC with Differential, Comprehensive metabolic panel (Cmet) - [facility], Lactate dehydrogenase (LDH) - [facility]  Osteoporosis, unspecified - Plan: DG Bone Density  Chief Complaint  Patient presents with  . Breast cancer, unspecified laterality    CURRENT THERAPY: Tamoxifen 20 mg daily since 04/13/2008.   INTERVAL HISTORY: Erika Campos 73 y.o. female with a history of left breast cancer is here for follow-up.  She was last seen by me on 01/30/2013.   She is accompanied by her husband today.  She reports that her weight is stable. She denies bone pain.  She had her flu shot last month.  Overall, she reports doing well.  Her last mammogram was this fall and reported to be without evidence of disease.  She states that she is taking her tamoxifen daily.   MEDICAL HISTORY: Past Medical History  Diagnosis Date  . Hypertension   . Diabetes mellitus   . Hyperlipidemia   . Cancer     breast    INTERIM HISTORY: has Breast cancer and Osteoporosis, unspecified on her problem list.    ALLERGIES:  is allergic to epinephrine hcl.  MEDICATIONS: has a current medication list which includes the following prescription(s): alendronate, amlodipine, aspirin, calcium-vitamin d, canagliflozin, glipizide, hydrochlorothiazide, losartan, losartan-hydrochlorothiazide, metformin, multivitamin, simvastatin, tamoxifen, and tradjenta.  SURGICAL HISTORY:  Past Surgical History  Procedure Laterality Date  . Cystectomy      lt breast   PROBLEM LIST:  1. 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 Estrogen receptor was 84%, progesterone receptor 95%. HER-2/neu was negative by [facility]. Proliferative fraction was 9%. Left axillary lymph node dissection revealed that all 15 lymph nodes were  negative. Tumor stage was T1a N0. Axillary dissection was carried out on 01/16/2008. 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 The patient received radiation treatments to the left breast. 6000 cGy in 30 fractions from 02/11/2008 through 03/29/2008. The patient is now on adjuvant tamoxifen since April 13, 2008.  2. Diabetes mellitus type 2, diagnosed in the early mid 1990s.  3. Hypertension, diagnosed about 2000.  4. Dyslipidemia.  5. Osteoporosis.  6. Mild lymphedema of the left arm following axillary dissection and radiation treatments.  7. History of an irregular pulse.  8. History of bladder polyps, status post cystoscopy by a urologist in [facility] in late August 2013. The polyps apparently were benign.   REVIEW OF SYSTEMS:   Constitutional: Denies fevers, chills or abnormal weight loss Eyes: Denies blurriness of vision Ears, nose, mouth, throat, and face: Denies mucositis or sore throat Respiratory: Denies cough, dyspnea or wheezes Cardiovascular: Denies palpitation, chest discomfort or lower extremity swelling Gastrointestinal:  Denies nausea, heartburn or change in bowel habits Skin: Denies abnormal skin rashes Lymphatics: Denies new lymphadenopathy or easy bruising Neurological:Denies numbness, tingling or new weaknesses Behavioral/Psych: Mood is stable, no new changes  All other systems were reviewed with the patient and are negative.  PHYSICAL EXAMINATION: ECOG PERFORMANCE STATUS: 0 - Asymptomatic  Blood pressure 124/81, pulse 72, temperature 98 F (36.7 C), temperature source Oral, resp. rate 18, height 5' (1.524 m), weight 124 lb (56.246 kg), SpO2 97.00%.  GENERAL:alert, no distress and comfortable: elderly female who appears her stated age.  SKIN: skin color, texture, turgor are normal, no  rashes or significant lesions EYES: normal, Conjunctiva are pink and non-injected, sclera clear OROPHARYNX:no exudate, no erythema and lips, buccal mucosa, and tongue normal  NECK: supple, thyroid normal size, non-tender, without nodularity LYMPH:  no palpable lymphadenopathy in the cervical, axillary or supraclavicular LUNGS: clear to auscultation and percussion with normal breathing effort HEART: regular rate & rhythm and no murmurs and no lower extremity edema ABDOMEN:abdomen soft, non-tender and normal bowel sounds BREASTS: Right breast without masses or nipple discharge; L breast with scarring beneath armpits s/p lymph node removals, breast without masses or nipple discharge or retraction.   Musculoskeletal:no cyanosis of digits and no clubbing  NEURO: alert & oriented x 3 with fluent speech, no focal motor/sensory deficits   LABORATORY DATA: Results for orders placed in visit on 07/31/13 (from the past 48 hour(s))  CBC WITH DIFFERENTIAL     Status: None   Collection Time    07/31/13  9:18 AM      Result Value Ref Range   WBC 5.5  3.9 - 10.3 10e3/uL   NEUT# 3.4  1.5 - 6.5 10e3/uL   HGB 14.4  11.6 - 15.9 g/dL   HCT 11.5  52.0 - 80.2 %   Platelets 182  145 - 400 10e3/uL   MCV 95.1  79.5 - 101.0 fL   MCH 31.7  25.1 - 34.0 pg   MCHC 33.4  31.5 - 36.0 g/dL   RBC 2.33  6.12 - 2.44 10e6/uL   RDW 12.8  11.2 - 14.5 %   lymph# 1.6  0.9 - 3.3 10e3/uL   MONO# 0.4  0.1 - 0.9 10e3/uL   Eosinophils Absolute 0.2  0.0 - 0.5 10e3/uL   Basophils Absolute 0.0  0.0 - 0.1 10e3/uL   NEUT% 60.8  38.4 - 76.8 %   LYMPH% 28.1  14.0 - 49.7 %   MONO% 7.3  0.0 - 14.0 %   EOS% 3.3  0.0 - 7.0 %   BASO% 0.5  0.0 - 2.0 %  COMPREHENSIVE METABOLIC PANEL (CC13)     Status: Abnormal   Collection Time    07/31/13  9:18 AM      Result Value Ref Range   Sodium 137  136 - 145 mEq/L   Potassium 3.9  3.5 - 5.1 mEq/L   Chloride 100  98 - 109 mEq/L   CO2 23  22 - 29 mEq/L   Glucose 191 (*) 70 - 140 mg/dl   BUN  97.5  7.0 - 30.0 mg/dL   Creatinine 1.1  0.6 - 1.1 mg/dL   Total Bilirubin 5.11  0.20 - 1.20 mg/dL   Alkaline Phosphatase 68  40 - 150 U/L   AST 22  5 - 34 U/L   ALT 19  0 - 55 U/L   Total Protein 7.4  6.4 - 8.3 g/dL   Albumin 3.7  3.5 - 5.0 g/dL   Calcium 9.9  8.4 - 02.1 mg/dL   Anion Gap 15 (*) 3 - 11 mEq/L       Labs:  Lab Results  Component Value Date   WBC 5.5 07/31/2013   HGB 14.4 07/31/2013   HCT 43.3 07/31/2013   MCV 95.1 07/31/2013   PLT 182 07/31/2013   NEUTROABS 3.4 07/31/2013      Chemistry      Component Value Date/Time   NA 137 07/31/2013 1173  NA 137 06/21/2011 0938   K 3.9 07/31/2013 0918   K 3.9 06/21/2011 0938   CL 101 06/27/2012 0951   CL 98 06/21/2011 0938   CO2 23 07/31/2013 0918   CO2 29 06/21/2011 0938   BUN 21.8 07/31/2013 0918   BUN 18 06/21/2011 0938   CREATININE 1.1 07/31/2013 0918   CREATININE 0.88 06/21/2011 0938      Component Value Date/Time   CALCIUM 9.9 07/31/2013 0918   CALCIUM 9.5 06/21/2011 0938   ALKPHOS 68 07/31/2013 0918   ALKPHOS 74 06/21/2011 0938   AST 22 07/31/2013 0918   AST 21 06/21/2011 0938   ALT 19 07/31/2013 0918   ALT 21 06/21/2011 0938   BILITOT 0.62 07/31/2013 0918   BILITOT 0.4 06/21/2011 0938     Basic Metabolic Panel:  Recent Labs Lab 07/31/13 0918  NA 137  K 3.9  CO2 23  GLUCOSE 191*  BUN 21.8  CREATININE 1.1  CALCIUM 9.9   GFR Estimated Creatinine Clearance: 35.3 ml/min (by C-G formula based on Cr of 1.1). Liver Function Tests:  Recent Labs Lab 07/31/13 0918  AST 22  ALT 19  ALKPHOS 68  BILITOT 0.62  PROT 7.4  ALBUMIN 3.7   CBC:  Recent Labs Lab 07/31/13 0918  WBC 5.5  NEUTROABS 3.4  HGB 14.4  HCT 43.3  MCV 95.1  PLT 182   Studies:  None   RADIOGRAPHIC STUDIES: 09/26/2014DIGITAL DIAGNOSTIC BILATERAL MAMMOGRAM  COMPARISON: Previous exams.  ACR Breast Density Category c: The breast tissue is heterogeneously  dense.  FINDINGS:  There are no suspicious masses or calcifications seen in either  breast. At  the surgical site in the left breast there is density and  architectural distortion consistent with a postlumpectomy scar. Spot  compression magnification view of the lumpectomy site was performed.  There is no evidence of locally recurrent disease at the lumpectomy  site.  Mammographic images were processed with CAD.  IMPRESSION:  Stable post lumpectomy changes in the left breast. No mammographic  evidence of malignancy in either breast.  RECOMMENDATION:  Screening mammogram in one year.(Code:SM-B-01Y)  I have discussed the findings and recommendations with the patient.  Results were also provided in writing at the conclusion of the  visit. If applicable, a reminder letter will be sent to the patient  regarding the next appointment.  BI-RADS CATEGORY 1: Negative  Electronically Signed  By: Everlean Alstrom M.D.  On: 12/19/2012 09:47    ASSESSMENT: Erika Campos 73 y.o. female with a history of Breast cancer - Plan: CBC with Differential, Comprehensive metabolic panel (Cmet) - [facility], Lactate dehydrogenase (LDH) - [facility]  Osteoporosis, unspecified - Plan: DG Bone Density   PLAN:  1. Breast cancer, left.  --Erika Campos continues to do well, now over 5 years out from the diagnosis of her invasive small low-grade cancer that occurred following conservative treatment of her DCIS involving the left breast.  The DCIS dates back to March 2007. The patient is doing well at this time. She will stop her tamoxifen, given she has completed 5 years of therapy (started on  January 2015).  Mammogram annually.  Last one as noted above.   2. Cancer screening.  . The patient had a  colonoscopy carried out in [place] in 2006 that apparently was negative.   3. Osteoporosis --Continue Alendronate, calcium carbonate-Vitamin D.  Repeat bone density within the next one week.   4. Follow-up. -- We will plan to see the patient again in 6  months at which time we will check CBC and chemistries.  All questions  were answered. The patient knows to call the clinic with any problems, questions or concerns. We can certainly see the patient much sooner if necessary.  I spent 10 minutes counseling the patient face to face. The total time spent in the appointment was 15 minutes.    Concha Norway, MD 07/31/2013 11:52 AM

## 2013-07-31 NOTE — Telephone Encounter (Signed)
Gave pt appty for [facility],pt will see MD on December date per pt rqst

## 2013-08-10 ENCOUNTER — Ambulatory Visit
Admission: RE | Admit: 2013-08-10 | Discharge: 2013-08-10 | Disposition: A | Discharge status: Home / Self Care | Payer: Medicare HMO | Source: Ambulatory Visit | Attending: Internal Medicine | Admitting: Internal Medicine

## 2013-08-10 DIAGNOSIS — M810 Age-related osteoporosis without current pathological fracture: Secondary | ICD-10-CM

## 2013-09-20 ENCOUNTER — Emergency Department (HOSPITAL_COMMUNITY)
Admission: EM | Admit: 2013-09-20 | Discharge: 2013-09-20 | Disposition: A | Discharge status: Home / Self Care | Payer: Medicare HMO | Attending: Emergency Medicine | Admitting: Emergency Medicine

## 2013-09-20 ENCOUNTER — Encounter (HOSPITAL_COMMUNITY): Payer: Self-pay | Admitting: Emergency Medicine

## 2013-09-20 DIAGNOSIS — Z79899 Other long term (current) drug therapy: Secondary | ICD-10-CM | POA: Insufficient documentation

## 2013-09-20 DIAGNOSIS — N760 Acute vaginitis: Secondary | ICD-10-CM | POA: Insufficient documentation

## 2013-09-20 DIAGNOSIS — E119 Type 2 diabetes mellitus without complications: Secondary | ICD-10-CM | POA: Insufficient documentation

## 2013-09-20 DIAGNOSIS — Z853 Personal history of malignant neoplasm of breast: Secondary | ICD-10-CM | POA: Insufficient documentation

## 2013-09-20 DIAGNOSIS — Z7982 Long term (current) use of aspirin: Secondary | ICD-10-CM | POA: Insufficient documentation

## 2013-09-20 DIAGNOSIS — I10 Essential (primary) hypertension: Secondary | ICD-10-CM | POA: Insufficient documentation

## 2013-09-20 DIAGNOSIS — E785 Hyperlipidemia, unspecified: Secondary | ICD-10-CM | POA: Insufficient documentation

## 2013-09-20 LAB — URINALYSIS, ROUTINE W REFLEX MICROSCOPIC
Bilirubin Urine: NEGATIVE
Glucose, UA: >1000 mg/dL — AB
HGB URINE DIPSTICK: NEGATIVE
Ketones, ur: NEGATIVE mg/dL
Leukocytes, UA: NEGATIVE
NITRITE: NEGATIVE
Protein, ur: NEGATIVE mg/dL
Specific Gravity, Urine: 1.03 (ref 1.005–1.030)
UROBILINOGEN UA: 0.2 mg/dL (ref 0.0–1.0)
pH: 6 (ref 5.0–8.0)

## 2013-09-20 LAB — RPR

## 2013-09-20 LAB — WET PREP, GENITAL
Clue Cells Wet Prep HPF POC: NONE SEEN
Trich, Wet Prep: NONE SEEN
Yeast Wet Prep HPF POC: NONE SEEN

## 2013-09-20 LAB — URINE MICROSCOPIC-ADD ON

## 2013-09-20 MED ORDER — FLUCONAZOLE 200 MG PO TABS: 200.0000 mg | ORAL_TABLET | Freq: Once | ORAL | Status: AC

## 2013-09-20 MED ORDER — METRONIDAZOLE 500 MG PO TABS: 2000.0000 mg | ORAL_TABLET | Freq: Once | ORAL | Status: DC

## 2013-09-20 NOTE — ED Provider Notes (Signed)
CSN: [id number]     Arrival date & time 09/20/13  2876 History   First MD Initiated Contact with Patient 09/20/13 1019     Chief Complaint  Patient presents with  . Vaginal Itching  . Vaginal Bleeding    HPI Pt has been having vaginal itching and irritation for 4 days.  She has tried otc meds including douches with no relief.  It is very itchy and now is unbearable.  She has noticed faint red color the other day but none recently.  No bright red vaginal bleeding or spotting.. No fevers.  No vomiting or diarrhea. Past Medical History  Diagnosis Date  . Hypertension   . Diabetes mellitus   . Hyperlipidemia   . Cancer     breast   Past Surgical History  Procedure Laterality Date  . Cystectomy      lt breast   No family history on file. History  Substance Use Topics  . Smoking status: Never Smoker   . Smokeless tobacco: Never Used  . Alcohol Use: No   OB History   Grav Para Term Preterm Abortions TAB SAB Ect Mult Living                 Review of Systems  All other systems reviewed and are negative.     Allergies  Epinephrine hcl  Home Medications   Prior to Admission medications   Medication Sig Start Date End Date Taking? Authorizing Laquon Emel  amLODipine (NORVASC) 5 MG tablet Take 5 mg by mouth every morning.    Yes Historical Carrie Usery, MD  aspirin 81 MG tablet Take 81 mg by mouth every morning.    Yes Historical Tenita Cue, MD  Calcium Carbonate-Vitamin D (CALCIUM-VITAMIN D) 500-200 MG-UNIT per tablet Take 1 tablet by mouth every morning.   Yes Historical Kalesha Irving, MD  Canagliflozin (INVOKANA) 100 MG TABS Take 1 tablet by mouth every morning.    Yes Historical Edmundo Tedesco, MD  glipiZIDE (GLUCOTROL) 5 MG tablet Take 5 mg by mouth 2 (two) times daily before a meal.     Yes Historical Luismario Coston, MD  losartan-hydrochlorothiazide (HYZAAR) 50-12.5 MG per tablet Take 1 tablet by mouth every morning.  04/14/13  Yes Historical Brayley Mackowiak, MD  metFORMIN (GLUCOPHAGE) 1000 MG tablet  Take 1,000 mg by mouth 2 (two) times daily with a meal.   Yes Historical Akayla Brass, MD  Multiple Vitamin (MULTIVITAMIN) capsule Take 1 capsule by mouth every morning.    Yes Historical Bina Veenstra, MD  simvastatin (ZOCOR) 20 MG tablet Take 20 mg by mouth every evening.   Yes Historical Temiloluwa Recchia, MD  TRADJENTA 5 MG TABS tablet Take 5 mg by mouth daily.  04/21/12  Yes Historical Kathrin Folden, MD  fluconazole (DIFLUCAN) 200 MG tablet Take 1 tablet (200 mg total) by mouth once. 09/20/13 09/27/13  Dorie Rank, MD  metroNIDAZOLE (FLAGYL) 500 MG tablet Take 4 tablets (2,000 mg total) by mouth once. 09/20/13   Dorie Rank, MD   BP 123/59  Pulse 66  Temp(Src) 98.2 F (36.8 C) (Oral)  Resp 16  SpO2 97% Physical Exam  Nursing note and vitals reviewed. Constitutional: She appears well-developed and well-nourished. No distress.  HENT:  Head: Normocephalic and atraumatic.  Right Ear: External ear normal.  Left Ear: External ear normal.  Eyes: Conjunctivae are normal. Right eye exhibits no discharge. Left eye exhibits no discharge. No scleral icterus.  Neck: Neck supple. No tracheal deviation present.  Cardiovascular: Normal rate.   Pulmonary/Chest: Effort normal. No stridor. No respiratory distress.  Abdominal: Hernia confirmed negative in the right inguinal area and confirmed negative in the left inguinal area.  Genitourinary: Uterus normal. There is no lesion on the right labia. There is no lesion on the left labia. Cervix exhibits discharge. Right adnexum displays no mass and no tenderness. Left adnexum displays no mass and no tenderness. No bleeding around the vagina. No foreign body around the vagina. Vaginal discharge found.  Musculoskeletal: She exhibits no edema.  Neurological: She is alert. Cranial nerve deficit: no gross deficits.  Skin: Skin is warm and dry. No rash noted.  Psychiatric: She has a normal mood and affect.    ED Course  Procedures (including critical care time) Labs Review Labs Reviewed   WET PREP, GENITAL - Abnormal; Notable for the following:    WBC, Wet Prep HPF POC FEW (*)    All other components within normal limits  URINALYSIS, ROUTINE W REFLEX MICROSCOPIC - Abnormal; Notable for the following:    Glucose, UA >1000 (*)    All other components within normal limits  GC/CHLAMYDIA PROBE AMP  URINE MICROSCOPIC-ADD ON  RPR  HIV ANTIBODY (ROUTINE TESTING)     MDM   Final diagnoses:  Vaginitis    Most consistent with a yeast infection based on exam however wet prep without yeast.  Will rx diflucan and flagyl to cover for BV and yeast.  Rec outpatient follow up with OB GYN    Dorie Rank, MD 09/20/13 1308

## 2013-09-20 NOTE — ED Notes (Signed)
Pt from home c/o vaginal itching, vaginal d/c and vaginal bleeding for 4 days. Pt has been treating at home with OTC creams with no relief. Pt denies N/V/D/F. Pt is A&O and in NAD. Pt speaks spanish, grown daughter is translating for her.

## 2013-09-20 NOTE — Discharge Instructions (Signed)

## 2013-09-21 LAB — GC/CHLAMYDIA PROBE AMP
CT Probe RNA: NEGATIVE
GC PROBE AMP APTIMA: NEGATIVE

## 2013-09-21 LAB — HIV ANTIBODY (ROUTINE TESTING W REFLEX): HIV 1&2 Ab, 4th Generation: NONREACTIVE

## 2013-12-03 ENCOUNTER — Other Ambulatory Visit (INDEPENDENT_AMBULATORY_CARE_PROVIDER_SITE_OTHER): Payer: Self-pay | Admitting: General Surgery

## 2013-12-03 DIAGNOSIS — Z853 Personal history of malignant neoplasm of breast: Secondary | ICD-10-CM

## 2013-12-22 ENCOUNTER — Ambulatory Visit
Admission: RE | Admit: 2013-12-22 | Discharge: 2013-12-22 | Disposition: A | Discharge status: Home / Self Care | Payer: Medicare HMO | Source: Ambulatory Visit | Attending: General Surgery | Admitting: General Surgery

## 2013-12-22 DIAGNOSIS — Z853 Personal history of malignant neoplasm of breast: Secondary | ICD-10-CM

## 2014-02-17 ENCOUNTER — Telehealth: Payer: Self-pay | Admitting: Hematology

## 2014-02-17 NOTE — Telephone Encounter (Signed)
moved 12/4 appt from [facility] to [place] 12/17. lmonvm for pt and mailed schedule.

## 2014-02-26 ENCOUNTER — Other Ambulatory Visit: Payer: Commercial Managed Care - HMO

## 2014-02-26 ENCOUNTER — Ambulatory Visit: Payer: Commercial Managed Care - HMO

## 2014-03-10 ENCOUNTER — Other Ambulatory Visit: Payer: Self-pay | Admitting: *Deleted

## 2014-03-10 DIAGNOSIS — C50919 Malignant neoplasm of unspecified site of unspecified female breast: Secondary | ICD-10-CM

## 2014-03-11 ENCOUNTER — Encounter: Payer: Self-pay | Admitting: Hematology

## 2014-03-11 ENCOUNTER — Other Ambulatory Visit (HOSPITAL_BASED_OUTPATIENT_CLINIC_OR_DEPARTMENT_OTHER): Payer: Medicare HMO

## 2014-03-11 ENCOUNTER — Ambulatory Visit (HOSPITAL_BASED_OUTPATIENT_CLINIC_OR_DEPARTMENT_OTHER): Payer: Medicare HMO | Admitting: Hematology

## 2014-03-11 ENCOUNTER — Telehealth: Payer: Self-pay | Admitting: Hematology

## 2014-03-11 VITALS — BP 145/69 | HR 70 | Temp 98.2°F | Resp 18 | Ht 60.0 in | Wt 124.4 lb

## 2014-03-11 DIAGNOSIS — C50919 Malignant neoplasm of unspecified site of unspecified female breast: Secondary | ICD-10-CM

## 2014-03-11 DIAGNOSIS — Z853 Personal history of malignant neoplasm of breast: Secondary | ICD-10-CM

## 2014-03-11 DIAGNOSIS — M810 Age-related osteoporosis without current pathological fracture: Secondary | ICD-10-CM

## 2014-03-11 LAB — COMPREHENSIVE METABOLIC PANEL (CC13)
ALT: 25 U/L (ref 0–55)
AST: 24 U/L (ref 5–34)
Albumin: 4 g/dL (ref 3.5–5.0)
Alkaline Phosphatase: 135 U/L (ref 40–150)
Anion Gap: 11 mEq/L (ref 3–11)
BILIRUBIN TOTAL: 0.51 mg/dL (ref 0.20–1.20)
BUN: 20.8 mg/dL (ref 7.0–26.0)
CALCIUM: 10 mg/dL (ref 8.4–10.4)
CO2: 30 mEq/L — ABNORMAL HIGH (ref 22–29)
CREATININE: 1 mg/dL (ref 0.6–1.1)
Chloride: 97 mEq/L — ABNORMAL LOW (ref 98–109)
EGFR: 56 mL/min/{1.73_m2} — ABNORMAL LOW (ref >90)
GLUCOSE: 175 mg/dL — AB (ref 70–140)
Potassium: 3.8 mEq/L (ref 3.5–5.1)
Sodium: 138 mEq/L (ref 136–145)
TOTAL PROTEIN: 7.7 g/dL (ref 6.4–8.3)

## 2014-03-11 LAB — CBC WITH DIFFERENTIAL/PLATELET
BASO%: 0.7 % (ref 0.0–2.0)
Basophils Absolute: 0 10*3/uL (ref 0.0–0.1)
EOS ABS: 0.1 10*3/uL (ref 0.0–0.5)
EOS%: 1.6 % (ref 0.0–7.0)
HEMATOCRIT: 43.5 % (ref 34.8–46.6)
HEMOGLOBIN: 14.6 g/dL (ref 11.6–15.9)
LYMPH%: 37.8 % (ref 14.0–49.7)
MCH: 31.6 pg (ref 25.1–34.0)
MCHC: 33.6 g/dL (ref 31.5–36.0)
MCV: 94.2 fL (ref 79.5–101.0)
MONO#: 0.3 10*3/uL (ref 0.1–0.9)
MONO%: 8 % (ref 0.0–14.0)
NEUT#: 2.2 10*3/uL (ref 1.5–6.5)
NEUT%: 51.9 % (ref 38.4–76.8)
Platelets: 171 10*3/uL (ref 145–400)
RBC: 4.62 10*6/uL (ref 3.70–5.45)
RDW: 12.5 % (ref 11.2–14.5)
WBC: 4.3 10*3/uL (ref 3.9–10.3)
lymph#: 1.6 10*3/uL (ref 0.9–3.3)

## 2014-03-11 LAB — LACTATE DEHYDROGENASE (CC13): LDH: 185 U/L (ref 125–245)

## 2014-03-11 NOTE — Telephone Encounter (Signed)
Pt confirmed labs/ov per 12/17 POF, gave pt AVS...... KJ °

## 2014-03-11 NOTE — Progress Notes (Signed)
Erika Campos OFFICE PROGRESS NOTE  Bartholome Bill, MD [address] [address]  DIAGNOSIS: Left breast cancer, stage IA  PROBLEM LIST:  1. 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 Estrogen receptor was 84%, progesterone receptor 95%. HER-2/neu was negative by CISH. Proliferative fraction was 9%. Left axillary lymph node dissection revealed that all 15 lymph nodes were negative. Tumor stage was T1a N0. Axillary dissection was carried out on 01/16/2008. 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 The patient received radiation treatments to the left breast. 6000 cGy in 30 fractions from 02/11/2008 through 03/29/2008. The patient finished 5 year adjuvant tamoxifen from April 13, 2008 to 07/31/2013 2. Diabetes mellitus type 2, diagnosed in the early mid 1990s.  3. Hypertension, diagnosed about 2000.  4. Dyslipidemia.  5. Osteoporosis.  6. Mild lymphedema of the left arm following axillary dissection and radiation treatments.  7. History of an irregular pulse.  8. History of bladder polyps, status post cystoscopy by a urologist in [facility] in late August 2013. The polyps apparently were benign.    CURRENT THERAPY: Observation  INTERVAL HISTORY: Erika Campos 73 y.o. female with a history of left breast cancer is here for follow-up.  She was last seen by Dr. Juliann Mule 6 month ago. She stopped her tamoxifen after her last visit.  She is doing very well, without any complaints.   MEDICAL HISTORY: Past Medical History  Diagnosis Date  . Hypertension   . Diabetes mellitus   . Hyperlipidemia   . Cancer     breast    INTERIM HISTORY: has Breast cancer and Osteoporosis, unspecified on her problem  list.    ALLERGIES:  is allergic to epinephrine hcl.  MEDICATIONS: has a current medication list which includes the following prescription(s): amlodipine, aspirin, calcium-vitamin d, canagliflozin, glipizide, losartan-hydrochlorothiazide, metformin, metronidazole, multivitamin, simvastatin, and tradjenta.  SURGICAL HISTORY:  Past Surgical History  Procedure Laterality Date  . Cystectomy      lt breast     REVIEW OF SYSTEMS:   Constitutional: Denies fevers, chills or abnormal weight loss Eyes: Denies blurriness of vision Ears, nose, mouth, throat, and face: Denies mucositis or sore throat Respiratory: Denies cough, dyspnea or wheezes Cardiovascular: Denies palpitation, chest discomfort or lower extremity swelling Gastrointestinal:  Denies nausea, heartburn or change in bowel habits Skin: Denies abnormal skin rashes Lymphatics: Denies new lymphadenopathy or easy bruising Neurological:Denies numbness, tingling or new weaknesses Behavioral/Psych: Mood is stable, no new changes  All other systems were reviewed with the patient and are negative.  PHYSICAL EXAMINATION: ECOG PERFORMANCE STATUS: 0 - Asymptomatic  Blood pressure 145/69, pulse 70, temperature 98.2 F (36.8 C), temperature source Oral, resp. rate 18, height 5' (1.524 m), weight 124 lb 6.4 oz (56.427 kg), SpO2 97 %.  GENERAL:alert, no distress and comfortable: elderly female who appears her stated age.  SKIN: skin color, texture, turgor are normal, no rashes or significant lesions EYES: normal, Conjunctiva are pink and non-injected, sclera clear OROPHARYNX:no exudate, no erythema and lips, buccal mucosa, and tongue normal  NECK: supple, thyroid normal size, non-tender, without nodularity LYMPH:  no palpable lymphadenopathy in the cervical, axillary or supraclavicular LUNGS: clear to auscultation and percussion with normal breathing effort HEART: regular rate &  rhythm and no murmurs and no lower extremity  edema ABDOMEN:abdomen soft, non-tender and normal bowel sounds BREASTS: Right breast without masses or nipple discharge; L breast with scarring beneath armpits s/p lymph node removals, breast without masses or nipple discharge or retraction.   Musculoskeletal:no cyanosis of digits and no clubbing  NEURO: alert & oriented x 3 with fluent speech, no focal motor/sensory deficits   LABORATORY DATA: CBC Latest Ref Rng 03/11/2014 07/31/2013 01/30/2013  WBC 3.9 - 10.3 10e3/uL 4.3 5.5 5.6  Hemoglobin 11.6 - 15.9 g/dL 14.6 14.4 14.1  Hematocrit 34.8 - 46.6 % 43.5 43.3 42.3  Platelets 145 - 400 10e3/uL 171 182 192    CMP Latest Ref Rng 03/11/2014 07/31/2013 01/30/2013  Glucose 70 - 140 mg/dl 175(H) 191(H) 184(H)  BUN 7.0 - 26.0 mg/dL 20.8 21.8 24.8  Creatinine 0.6 - 1.1 mg/dL 1.0 1.1 0.9  Sodium 136 - 145 mEq/L 138 137 140  Potassium 3.5 - 5.1 mEq/L 3.8 3.9 4.2  Chloride 98 - 107 mEq/L - - -  CO2 22 - 29 mEq/L 30(H) 23 27  Calcium 8.4 - 10.4 mg/dL 10.0 9.9 10.4  Total Protein 6.4 - 8.3 g/dL 7.7 7.4 8.0  Total Bilirubin 0.20 - 1.20 mg/dL 0.51 0.62 0.49  Alkaline Phos 40 - 150 U/L 135 68 69  AST 5 - 34 U/L $Remo'24 22 24  'QFvRY$ ALT 0 - 55 U/L $Remo'25 19 24     'rwWYB$ RADIOGRAPHIC STUDIES: 12/22/2013 DIGITAL DIAGNOSTIC BILATERAL MAMMOGRAM  IMPRESSION: No mammographic evidence of malignancy. A result letter of this screening mammogram will be mailed directly to the patient.   ASSESSMENT: Erika Campos 73 y.o. female with a history of left breast DCIS and subsequent stage Ia invasive breast cancer.   PLAN:  1. Breast cancer, left.  -She has completed 5 years adjuvant tamoxifen. -She is clinically doing very well. Recent bilateral mammogram was negative for evidence of malignancy. Today's physical exam was unremarkable. Lab is normal. -She will continue mammogram once a year  2. Cancer screening.  . The patient had a  colonoscopy carried out in [place] in 2006 that apparently was negative.   3.  Osteoporosis --Continue Alendronate, calcium carbonate-Vitamin D.  Repeat bone density within the next one week.   4. Follow-up. -- We will plan to see the patient again in one year with lab CBC and chemistries.  All questions were answered. The patient knows to call the clinic with any problems, questions or concerns. We can certainly see the patient much sooner if necessary.  I spent 10 minutes counseling the patient face to face. The total time spent in the appointment was 15 minutes.    Truitt Merle, MD 03/11/2014 10:48 AM

## 2014-10-25 LAB — HM DIABETES EYE EXAM

## 2014-12-01 ENCOUNTER — Other Ambulatory Visit: Payer: Self-pay

## 2014-12-01 DIAGNOSIS — Z1231 Encounter for screening mammogram for malignant neoplasm of breast: Secondary | ICD-10-CM

## 2014-12-15 ENCOUNTER — Encounter: Payer: Medicare HMO | Attending: Family Medicine | Admitting: *Deleted

## 2014-12-15 ENCOUNTER — Encounter: Payer: Self-pay | Admitting: *Deleted

## 2014-12-15 VITALS — Ht 60.0 in | Wt 123.9 lb

## 2014-12-15 DIAGNOSIS — E118 Type 2 diabetes mellitus with unspecified complications: Secondary | ICD-10-CM | POA: Diagnosis not present

## 2014-12-15 DIAGNOSIS — Z713 Dietary counseling and surveillance: Secondary | ICD-10-CM | POA: Insufficient documentation

## 2014-12-15 DIAGNOSIS — E1165 Type 2 diabetes mellitus with hyperglycemia: Secondary | ICD-10-CM

## 2014-12-15 DIAGNOSIS — IMO0002 Reserved for concepts with insufficient information to code with codable children: Secondary | ICD-10-CM

## 2014-12-15 NOTE — Patient Instructions (Signed)
Plan:  Aim for 2 Carb Choices per meal (30 grams) +/- 1 either way  Aim for 0-1 Carbs per snack if hungry  Include protein in moderation with your meals and snacks Consider reading food labels for Total Carbohydrate of foods Consider  increasing your activity level by walking either outside or on your treadmill  for15 minutes daily and increase as tolerated Continue checking BG at alternate times per day as directed by MD  Continue taking diabetes medication as directed by MD

## 2014-12-16 NOTE — Progress Notes (Signed)
Diabetes Self-Management Education  Visit Type: First/Initial  Appt. Start Time: 0930 Appt. End Time: 1100  12/16/2014  [name], identified by name and date of birth, is a 74 y.o. female with a diagnosis of Diabetes: Type 2.   ASSESSMENT  Height 5' (1.524 m), weight 123 lb 14.4 oz (56.201 kg). Body mass index is 24.2 kg/(m^2).      Diabetes Self-Management Education - 12/15/14 0949    Visit Information   Visit Type First/Initial   Initial Visit   Diabetes Type Type 2   Are you taking your medications as prescribed? Yes   Date Diagnosed 20 years   Health Coping   How would you rate your overall health? Good   Psychosocial Assessment   Self-care barriers English as a second language   Other persons present Patient;Spouse/SO;Interpreter   Patient Concerns Nutrition/Meal planning   Special Needs Other (comment)  interpretor   Preferred Learning Style Visual   Learning Readiness Ready   Complications   Last HgB A1C per patient/outside source 9.1 %   How often do you check your blood sugar? 3-4 times/day   Fasting Blood glucose range (mg/dL) 180-200;130-179   Postprandial Blood glucose range (mg/dL) >200   Number of hypoglycemic episodes per month 0   Have you had a dilated eye exam in the past 12 months? Yes   Have you had a dental exam in the past 12 months? Yes   Are you checking your feet? Yes   How many days per week are you checking your feet? 7   Dietary Intake   Breakfast wheat bread with butter, small meal, and will have eggs occasionally,    Snack (morning) occasionally fruit   Lunch skips occasionally, might have fruit,    Snack (afternoon) only if hungry, more fruit or Cheerios with milk   Dinner rice and beans, vegetable OR lean meat, starch, vegetables   Snack (evening) graham crackers and 2% milk   Beverage(s) coffee with milk and Splenda, water   Exercise   Exercise Type Light (walking / raking leaves)  was walking until starting caring for grand  child. Was walking 4 miles   How many days per week to you exercise? 0   How many minutes per day do you exercise? 0   Total minutes per week of exercise 0   Patient Education   Previous Diabetes Education Yes (please comment)  over 10 years ago in [place]   Disease state  Definition of diabetes, type 1 and 2, and the diagnosis of diabetes   Nutrition management  Role of diet in the treatment of diabetes and the relationship between the three main macronutrients and blood glucose level;Carbohydrate counting   Physical activity and exercise  Role of exercise on diabetes management, blood pressure control and cardiac health.   Medications Reviewed patients medication for diabetes, action, purpose, timing of dose and side effects.   Monitoring Purpose and frequency of SMBG.;Identified appropriate SMBG and/or A1C goals.   Acute complications Taught treatment of hypoglycemia - the 15 rule.   Individualized Goals (developed by patient)   Nutrition Follow meal plan discussed;General guidelines for healthy choices and portions discussed   Physical Activity Exercise 3-5 times per week   Medications take my medication as prescribed   Monitoring  test blood glucose pre and post meals as discussed   Outcomes   Expected Outcomes Demonstrated interest in learning. Expect positive outcomes   Future DMSE 3-4 months   Program Status Not Completed  Individualized Plan for Diabetes Self-Management Training:   Learning Objective:  Patient will have a greater understanding of diabetes self-management. Patient education plan is to attend individual and/or group sessions per assessed needs and concerns.   Plan:   Patient Instructions  Plan:  Aim for 2 Carb Choices per meal (30 grams) +/- 1 either way  Aim for 0-1 Carbs per snack if hungry  Include protein in moderation with your meals and snacks Consider reading food labels for Total Carbohydrate of foods Consider  increasing your activity level by  walking either outside or on your treadmill  for15 minutes daily and increase as tolerated Continue checking BG at alternate times per day as directed by MD  Continue taking diabetes medication as directed by MD      Expected Outcomes:  Demonstrated interest in learning. Expect positive outcomes  Education material provided: Spanish version of: Living Well with Diabetes, A1C conversion sheet, Meal plan card and Carbohydrate counting sheet  If problems or questions, patient to contact team via:  Phone and Email  Future DSME appointment: 3-4 months

## 2015-01-12 ENCOUNTER — Ambulatory Visit
Admission: RE | Admit: 2015-01-12 | Discharge: 2015-01-12 | Disposition: A | Discharge status: Home / Self Care | Payer: Medicare HMO | Source: Ambulatory Visit

## 2015-01-12 DIAGNOSIS — Z1231 Encounter for screening mammogram for malignant neoplasm of breast: Secondary | ICD-10-CM

## 2015-01-14 ENCOUNTER — Other Ambulatory Visit: Payer: Self-pay | Admitting: General Surgery

## 2015-01-14 DIAGNOSIS — R928 Other abnormal and inconclusive findings on diagnostic imaging of breast: Secondary | ICD-10-CM

## 2015-01-19 ENCOUNTER — Ambulatory Visit
Admission: RE | Admit: 2015-01-19 | Discharge: 2015-01-19 | Disposition: A | Discharge status: Home / Self Care | Payer: Medicare HMO | Source: Ambulatory Visit | Attending: General Surgery | Admitting: General Surgery

## 2015-01-19 ENCOUNTER — Other Ambulatory Visit: Payer: Medicare HMO

## 2015-01-19 DIAGNOSIS — R928 Other abnormal and inconclusive findings on diagnostic imaging of breast: Secondary | ICD-10-CM

## 2015-03-04 ENCOUNTER — Telehealth: Payer: Self-pay | Admitting: Hematology

## 2015-03-04 NOTE — Telephone Encounter (Signed)
pt cld to r/s appt-gave pt cr/s time & date

## 2015-03-10 ENCOUNTER — Ambulatory Visit: Payer: Medicare HMO | Admitting: Hematology

## 2015-03-10 ENCOUNTER — Other Ambulatory Visit: Payer: Medicare HMO

## 2015-04-12 ENCOUNTER — Ambulatory Visit: Payer: Medicare HMO | Admitting: Hematology

## 2015-04-12 ENCOUNTER — Other Ambulatory Visit: Payer: Medicare HMO

## 2015-04-13 ENCOUNTER — Other Ambulatory Visit: Payer: Medicare HMO

## 2015-04-13 ENCOUNTER — Ambulatory Visit: Payer: Medicare HMO | Admitting: Hematology

## 2015-04-20 ENCOUNTER — Other Ambulatory Visit: Payer: Self-pay | Admitting: *Deleted

## 2015-04-20 DIAGNOSIS — C50912 Malignant neoplasm of unspecified site of left female breast: Secondary | ICD-10-CM

## 2015-04-21 ENCOUNTER — Telehealth: Payer: Self-pay | Admitting: Hematology

## 2015-04-21 ENCOUNTER — Ambulatory Visit (HOSPITAL_BASED_OUTPATIENT_CLINIC_OR_DEPARTMENT_OTHER): Payer: Medicare HMO | Admitting: Hematology

## 2015-04-21 ENCOUNTER — Other Ambulatory Visit (HOSPITAL_BASED_OUTPATIENT_CLINIC_OR_DEPARTMENT_OTHER): Payer: Medicare HMO

## 2015-04-21 ENCOUNTER — Encounter: Payer: Self-pay | Admitting: Hematology

## 2015-04-21 ENCOUNTER — Other Ambulatory Visit: Payer: Self-pay | Admitting: Hematology

## 2015-04-21 VITALS — BP 145/69 | HR 69 | Temp 98.4°F | Resp 18 | Ht 60.0 in | Wt 125.6 lb

## 2015-04-21 DIAGNOSIS — M810 Age-related osteoporosis without current pathological fracture: Secondary | ICD-10-CM

## 2015-04-21 DIAGNOSIS — Z853 Personal history of malignant neoplasm of breast: Secondary | ICD-10-CM

## 2015-04-21 DIAGNOSIS — C50912 Malignant neoplasm of unspecified site of left female breast: Secondary | ICD-10-CM

## 2015-04-21 DIAGNOSIS — E2839 Other primary ovarian failure: Secondary | ICD-10-CM

## 2015-04-21 DIAGNOSIS — Z1231 Encounter for screening mammogram for malignant neoplasm of breast: Secondary | ICD-10-CM

## 2015-04-21 LAB — CBC WITH DIFFERENTIAL/PLATELET
BASO%: 0.8 % (ref 0.0–2.0)
Basophils Absolute: 0 10*3/uL (ref 0.0–0.1)
EOS%: 1.5 % (ref 0.0–7.0)
Eosinophils Absolute: 0.1 10*3/uL (ref 0.0–0.5)
HCT: 44.9 % (ref 34.8–46.6)
HGB: 15 g/dL (ref 11.6–15.9)
LYMPH%: 28.2 % (ref 14.0–49.7)
MCH: 31.6 pg (ref 25.1–34.0)
MCHC: 33.3 g/dL (ref 31.5–36.0)
MCV: 94.8 fL (ref 79.5–101.0)
MONO#: 0.4 10*3/uL (ref 0.1–0.9)
MONO%: 7.4 % (ref 0.0–14.0)
NEUT#: 3 10*3/uL (ref 1.5–6.5)
NEUT%: 62.1 % (ref 38.4–76.8)
Platelets: 175 10*3/uL (ref 145–400)
RBC: 4.74 10*6/uL (ref 3.70–5.45)
RDW: 13.1 % (ref 11.2–14.5)
WBC: 4.9 10*3/uL (ref 3.9–10.3)
lymph#: 1.4 10*3/uL (ref 0.9–3.3)

## 2015-04-21 LAB — COMPREHENSIVE METABOLIC PANEL
ALT: 23 U/L (ref 0–55)
AST: 21 U/L (ref 5–34)
Albumin: 4.1 g/dL (ref 3.5–5.0)
Alkaline Phosphatase: 95 U/L (ref 40–150)
Anion Gap: 12 mEq/L — ABNORMAL HIGH (ref 3–11)
BILIRUBIN TOTAL: 0.74 mg/dL (ref 0.20–1.20)
BUN: 28.6 mg/dL — AB (ref 7.0–26.0)
CO2: 28 meq/L (ref 22–29)
Calcium: 10.2 mg/dL (ref 8.4–10.4)
Chloride: 95 mEq/L — ABNORMAL LOW (ref 98–109)
Creatinine: 1.1 mg/dL (ref 0.6–1.1)
EGFR: 48 mL/min/{1.73_m2} — ABNORMAL LOW (ref >90)
GLUCOSE: 271 mg/dL — AB (ref 70–140)
Potassium: 4.6 mEq/L (ref 3.5–5.1)
SODIUM: 135 meq/L — AB (ref 136–145)
Total Protein: 7.8 g/dL (ref 6.4–8.3)

## 2015-04-21 NOTE — Telephone Encounter (Signed)
Gv pt appts for Jan 2018 along with mammo and bone density in oct.

## 2015-04-21 NOTE — Progress Notes (Signed)
[facility] OFFICE PROGRESS NOTE  Bartholome Bill, MD [address] [address]  DIAGNOSIS: Left breast cancer, stage IA  PROBLEM LIST:  1. 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 Estrogen receptor was 84%, progesterone receptor 95%. HER-2/neu was negative by CISH. Proliferative fraction was 9%. Left axillary lymph node dissection revealed that all 15 lymph nodes were negative. Tumor stage was T1a N0. Axillary dissection was carried out on 01/16/2008. 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 The patient received radiation treatments to the left breast. 6000 cGy in 30 fractions from 02/11/2008 through 03/29/2008. The patient finished 5 year adjuvant tamoxifen from April 13, 2008 to 07/31/2013 2. Diabetes mellitus type 2, diagnosed in the early mid 1990s.  3. Hypertension, diagnosed about 2000.  4. Dyslipidemia.  5. Osteoporosis.  6. Mild lymphedema of the left arm following axillary dissection and radiation treatments.  7. History of an irregular pulse.  8. History of bladder polyps, status post cystoscopy by a urologist in [facility] in late August 2013. The polyps apparently were benign.    CURRENT THERAPY: Observation  INTERVAL HISTORY: Erika Campos 75 y.o. female with a history of left breast cancer is here for follow-up.  She was last seen by me 1 year ago. She has been doing very well, denies any pain, dyspnea, abdominal discomfort or other new symptoms. No hospitalization or ED visit in the past year. She has good appetite, eating well, and is doing well at home.  MEDICAL HISTORY: Past Medical History  Diagnosis Date  . Hypertension   . Diabetes mellitus   . Hyperlipidemia   .  Cancer [facility])     breast    INTERIM HISTORY: has Breast cancer ([address]) and Osteoporosis, unspecified on her problem list.    ALLERGIES:  is allergic to epinephrine hcl.  MEDICATIONS: has a current medication list which includes the following prescription(s): alendronate, amlodipine, aspirin, calcium-vitamin d, canagliflozin, clotrimazole-betamethasone, ergocalciferol, glipizide, losartan-hydrochlorothiazide, metformin, metronidazole, multivitamin, simvastatin, tradjenta, and vitamin d (ergocalciferol).  SURGICAL HISTORY:  Past Surgical History  Procedure Laterality Date  . Cystectomy      lt breast     REVIEW OF SYSTEMS:   Constitutional: Denies fevers, chills or abnormal weight loss Eyes: Denies blurriness of vision Ears, nose, mouth, throat, and face: Denies mucositis or sore throat Respiratory: Denies cough, dyspnea or wheezes Cardiovascular: Denies palpitation, chest discomfort or lower extremity swelling Gastrointestinal:  Denies nausea, heartburn or change in bowel habits Skin: Denies abnormal skin rashes Lymphatics: Denies new lymphadenopathy or easy bruising Neurological:Denies numbness, tingling or new weaknesses Behavioral/Psych: Mood is stable, no new changes  All other systems were reviewed with the patient and are negative.  PHYSICAL EXAMINATION: ECOG PERFORMANCE STATUS: 0 - Asymptomatic  Blood pressure 145/69, pulse 69, temperature 98.4 F (36.9 C), temperature source Oral, resp. rate 18, height 5' (1.524 m), weight 125 lb 9.6 oz (56.972 kg), SpO2 99 %.  GENERAL:alert, no distress and comfortable: elderly female who appears her stated age.  SKIN: skin color, texture, turgor are normal, no rashes or significant lesions EYES: normal, Conjunctiva are pink and non-injected, sclera clear OROPHARYNX:no exudate, no erythema and lips, buccal mucosa, and tongue normal  NECK: supple, thyroid normal size, non-tender, without  nodularity LYMPH:  no palpable lymphadenopathy in  the cervical, axillary or supraclavicular LUNGS: clear to auscultation and percussion with normal breathing effort HEART: regular rate & rhythm and no murmurs and no lower extremity edema ABDOMEN:abdomen soft, non-tender and normal bowel sounds BREASTS: Right breast without masses or nipple discharge; L breast with scarring beneath armpits s/p lymph node removals, breast without masses or nipple discharge or retraction.   Musculoskeletal:no cyanosis of digits and no clubbing  NEURO: alert & oriented x 3 with fluent speech, no focal motor/sensory deficits   LABORATORY DATA: CBC Latest Ref Rng 04/21/2015 03/11/2014 07/31/2013  WBC 3.9 - 10.3 10e3/uL 4.9 4.3 5.5  Hemoglobin 11.6 - 15.9 g/dL 15.0 14.6 14.4  Hematocrit 34.8 - 46.6 % 44.9 43.5 43.3  Platelets 145 - 400 10e3/uL 175 171 182    CMP Latest Ref Rng 03/11/2014 07/31/2013 01/30/2013  Glucose 70 - 140 mg/dl 175(H) 191(H) 184(H)  BUN 7.0 - 26.0 mg/dL 20.8 21.8 24.8  Creatinine 0.6 - 1.1 mg/dL 1.0 1.1 0.9  Sodium 136 - 145 mEq/L 138 137 140  Potassium 3.5 - 5.1 mEq/L 3.8 3.9 4.2  Chloride 98 - 107 mEq/L - - -  CO2 22 - 29 mEq/L 30(H) 23 27  Calcium 8.4 - 10.4 mg/dL 10.0 9.9 10.4  Total Protein 6.4 - 8.3 g/dL 7.7 7.4 8.0  Total Bilirubin 0.20 - 1.20 mg/dL 0.51 0.62 0.49  Alkaline Phos 40 - 150 U/L 135 68 69  AST 5 - 34 U/L _0 ALT 0 - 55 U/L _1 RADIOGRAPHIC STUDIES: DIGITAL DIAGNOSTIC BILATERAL MAMMOGRAM 01/19/2015 IMPRESSION: No evidence of malignancy in the right breast.  RECOMMENDATION: Bilateral screening mammogram in 1 year is recommended.   ASSESSMENT: Erika Campos 75 y.o. female with a history of left breast DCIS and subsequent stage Ia invasive breast cancer.   PLAN:  1. History of stage Ia left breast cancer, ER+/PR+/HER2- -She has completed 5 years adjuvant tamoxifen. Given her very early stage disease, I do not think she needs extended hormonal therapy. -She is clinically doing very well. Recent  bilateral mammogram was negative for evidence of malignancy. Today's physical exam was unremarkable. Lab is normal. -She will continue mammogram once a year -We discussed survivorship clinic for long-term follow-up. Patient prefers to be followed with me, I'll see her back in one year.  2. Cancer screening.  . The patient had a colonoscopy carried out in [place] in 2006 that apparently was negative.   3. Osteoporosis --Continue Alendronate, calcium carbonate-Vitamin D.  Repeat bone density in 12/2015    4. Follow-up. -- We will plan to see the patient again in one year with lab CBC and chemistries.  All questions were answered. The patient knows to call the clinic with any problems, questions or concerns. We can certainly see the patient much sooner if necessary.  I spent 10 minutes counseling the patient face to face. The total time spent in the appointment was 15 minutes.    Truitt Merle, MD 04/21/2015 9:05 AM

## 2016-01-13 ENCOUNTER — Other Ambulatory Visit: Payer: Medicare HMO

## 2016-01-13 ENCOUNTER — Ambulatory Visit: Payer: Medicare HMO

## 2016-01-19 ENCOUNTER — Other Ambulatory Visit: Payer: Medicare HMO

## 2016-01-19 ENCOUNTER — Ambulatory Visit: Payer: Medicare HMO

## 2016-02-03 ENCOUNTER — Other Ambulatory Visit: Payer: Medicare HMO

## 2016-02-03 ENCOUNTER — Ambulatory Visit: Payer: Medicare HMO

## 2016-02-14 ENCOUNTER — Ambulatory Visit
Admission: RE | Admit: 2016-02-14 | Discharge: 2016-02-14 | Disposition: A | Discharge status: Home / Self Care | Payer: Medicare HMO | Source: Ambulatory Visit | Attending: Hematology | Admitting: Hematology

## 2016-02-14 DIAGNOSIS — Z1231 Encounter for screening mammogram for malignant neoplasm of breast: Secondary | ICD-10-CM

## 2016-02-14 DIAGNOSIS — E2839 Other primary ovarian failure: Secondary | ICD-10-CM

## 2016-04-19 ENCOUNTER — Other Ambulatory Visit: Payer: Medicare HMO

## 2016-04-19 ENCOUNTER — Ambulatory Visit: Payer: Medicare HMO | Admitting: Hematology

## 2016-04-20 ENCOUNTER — Telehealth: Payer: Self-pay | Admitting: Hematology

## 2016-04-20 NOTE — Telephone Encounter (Signed)
Patient came to Scheduling dept, to reschedule her missed appointment. Appointment rescheduled per patient request. Patient is aware.  04/20/16

## 2016-04-26 NOTE — Progress Notes (Signed)
[place] OFFICE PROGRESS NOTE  Bartholome Bill, MD [address] [address]  DIAGNOSIS: Left breast cancer, stage IA  PROBLEM LIST:  1. 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 Estrogen receptor was 84%, progesterone receptor 95%. HER-2/neu was negative by C[facility]. Proliferative fraction was 9%. Left axillary lymph node dissection revealed that all 15 lymph nodes were negative. Tumor stage was T1a N0. Axillary dissection was carried out on 01/16/2008. 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 The patient received radiation treatments to the left breast. 6000 cGy in 30 fractions from 02/11/2008 through 03/29/2008. The patient finished 5 year adjuvant tamoxifen from April 13, 2008 to 07/31/2013 2. Diabetes mellitus type 2, diagnosed in the early mid 1990s.  3. Hypertension, diagnosed about 2000.  4. Dyslipidemia.  5. Osteoporosis.  6. Mild lymphedema of the left arm following axillary dissection and radiation treatments.  7. History of an irregular pulse.  8. History of bladder polyps, status post cystoscopy by a urologist in [facility] in late August 2013. The polyps apparently were benign.    CURRENT THERAPY: Observation  INTERVAL HISTORY: Erika Campos 76 y.o. female with a history of left breast cancer is here for follow-up.  She was last seen by me 1 year ago. She is doing well overall. Denies any hospitalization or major change of her house in the past year. She denies any significant pain, or other discomfort. She remains to be physically active, she takes calf her 57-year-old grandson. She does not exercise. She forgot to take her blood pressure medication this morning, denies any  headaches or chest pain.  MEDICAL HISTORY: Past Medical History:  Diagnosis Date  . Cancer (HCC)    breast  . Diabetes mellitus   . Hyperlipidemia   . Hypertension     INTERIM HISTORY: has History of left breast cancer and Osteoporosis on her problem list.    ALLERGIES:  is allergic to epinephrine.  MEDICATIONS: has a current medication list which includes the following prescription(s): amlodipine, aspirin, calcium-vitamin d, canagliflozin, ergocalciferol, glipizide, losartan-hydrochlorothiazide, metformin, metronidazole, multivitamin, simvastatin, and tradjenta.  SURGICAL HISTORY:  Past Surgical History:  Procedure Laterality Date  . CYSTECTOMY     lt breast     REVIEW OF SYSTEMS:   Constitutional: Denies fevers, chills or abnormal weight loss Eyes: Denies blurriness of vision Ears, nose, mouth, throat, and face: Denies mucositis or sore throat Respiratory: Denies cough, dyspnea or wheezes Cardiovascular: Denies palpitation, chest discomfort or lower extremity swelling Gastrointestinal:  Denies nausea, heartburn or change in bowel habits Skin: Denies abnormal skin rashes Lymphatics: Denies new lymphadenopathy or easy bruising Neurological:Denies numbness, tingling or new weaknesses Behavioral/Psych: Mood is stable, no new changes  All other systems were reviewed with the patient and are negative.  PHYSICAL EXAMINATION: ECOG PERFORMANCE STATUS: 0 - Asymptomatic  Blood pressure (!) 196/77, pulse 62, temperature 97.8 F (36.6 C), temperature source Oral, resp. rate 18, height 5' (1.524 m), weight 128 lb 14.4 oz (58.5 kg), SpO2 99 %.  GENERAL:alert, no distress and comfortable: elderly female who appears her stated age.  SKIN: skin color, texture, turgor are normal, no rashes or significant lesions EYES: normal, Conjunctiva are pink and non-injected, sclera clear OROPHARYNX:no exudate, no erythema  and lips, buccal mucosa, and tongue normal  NECK: supple, thyroid normal  size, non-tender, without nodularity LYMPH:  no palpable lymphadenopathy in the cervical, axillary or supraclavicular LUNGS: clear to auscultation and percussion with normal breathing effort HEART: regular rate & rhythm and no murmurs and no lower extremity edema ABDOMEN:abdomen soft, non-tender and normal bowel sounds BREASTS: Right breast without masses or nipple discharge; L breast with scarring beneath armpits s/p lymph node removals, breast without masses or nipple discharge or retraction.   Musculoskeletal:no cyanosis of digits and no clubbing  NEURO: alert & oriented x 3 with fluent speech, no focal motor/sensory deficits   LABORATORY DATA: CBC Latest Ref Rng & Units 04/27/2016 04/21/2015 03/11/2014  WBC 3.9 - 10.3 10e3/uL 4.1 4.9 4.3  Hemoglobin 11.6 - 15.9 g/dL 13.9 15.0 14.6  Hematocrit 34.8 - 46.6 % 39.7 44.9 43.5  Platelets 145 - 400 10e3/uL 166 175 171    CMP Latest Ref Rng & Units 04/27/2016 04/21/2015 03/11/2014  Glucose 70 - 140 mg/dl 159(H) 271(H) 175(H)  BUN 7.0 - 26.0 mg/dL 17.9 28.6(H) 20.8  Creatinine 0.6 - 1.1 mg/dL 0.9 1.1 1.0  Sodium 136 - 145 mEq/L 138 135(L) 138  Potassium 3.5 - 5.1 mEq/L 4.6 4.6 3.8  Chloride 98 - 107 mEq/L - - -  CO2 22 - 29 mEq/L 30(H) 28 30(H)  Calcium 8.4 - 10.4 mg/dL 10.5(H) 10.2 10.0  Total Protein 6.4 - 8.3 g/dL 7.8 7.8 7.7  Total Bilirubin 0.20 - 1.20 mg/dL 0.63 0.74 0.51  Alkaline Phos 40 - 150 U/L 114 95 135  AST 5 - 34 U/L 21 21 24   ALT 0 - 55 U/L 25 23 25      RADIOGRAPHIC STUDIES: DIGITAL DIAGNOSTIC BILATERAL MAMMOGRAM 01/19/2015 IMPRESSION: No evidence of malignancy in the right breast.  RECOMMENDATION: Bilateral screening mammogram in 1 year is recommended.   ASSESSMENT: Erika Campos 76 y.o. female with a history of left breast DCIS and subsequent stage Ia invasive breast cancer.   PLAN:  1. History of stage Ia left breast cancer, ER+/PR+/HER2- -She has completed 5 years adjuvant tamoxifen. Given her very early stage  disease, I do not think she needs extended hormonal therapy. -She is clinically doing very well. Recent bilateral mammogram was negative for evidence of malignancy. Today's physical exam was unremarkable. Lab is normal. -She will continue mammogram once a year -Patient prefers to continue surveillance with [place], I'll see her back in one year -I encouraged her to be physically active, and have healthy diet  2. Cancer screening.  .-The patient had a colonoscopy carried out in [place] in 2006 that apparently was negative.  -Her primary care physician has stopped her Pap smear due to her age.  3. Osteoporosis --Repeated bone density showed osteoporosis, T score -2.7, slightly worse than 2 years ago.  -She used to be on Alendronate, I encouraged her to resume. She will continue calcium carbonate-Vitamin D.    4. Follow-up. -- We will plan to see the patient again in one year with lab CBC and chemistries.  All questions were answered. The patient knows to call the clinic with any problems, questions or concerns. We can certainly see the patient much sooner if necessary.  I spent 10 minutes counseling the patient face to face. The total time spent in the appointment was 15 minutes.    Truitt Merle, MD 04/27/16 9:43 AM

## 2016-04-27 ENCOUNTER — Ambulatory Visit (HOSPITAL_BASED_OUTPATIENT_CLINIC_OR_DEPARTMENT_OTHER): Payer: Medicare HMO | Admitting: Hematology

## 2016-04-27 ENCOUNTER — Encounter: Payer: Self-pay | Admitting: Hematology

## 2016-04-27 ENCOUNTER — Other Ambulatory Visit (HOSPITAL_BASED_OUTPATIENT_CLINIC_OR_DEPARTMENT_OTHER): Payer: Medicare HMO

## 2016-04-27 ENCOUNTER — Telehealth: Payer: Self-pay | Admitting: Hematology

## 2016-04-27 VITALS — BP 196/77 | HR 62 | Temp 97.8°F | Resp 18 | Ht 60.0 in | Wt 128.9 lb

## 2016-04-27 DIAGNOSIS — Z853 Personal history of malignant neoplasm of breast: Secondary | ICD-10-CM | POA: Diagnosis not present

## 2016-04-27 DIAGNOSIS — M810 Age-related osteoporosis without current pathological fracture: Secondary | ICD-10-CM

## 2016-04-27 LAB — COMPREHENSIVE METABOLIC PANEL
ALBUMIN: 4.1 g/dL (ref 3.5–5.0)
ALK PHOS: 114 U/L (ref 40–150)
ALT: 25 U/L (ref 0–55)
AST: 21 U/L (ref 5–34)
Anion Gap: 8 mEq/L (ref 3–11)
BILIRUBIN TOTAL: 0.63 mg/dL (ref 0.20–1.20)
BUN: 17.9 mg/dL (ref 7.0–26.0)
CO2: 30 mEq/L — ABNORMAL HIGH (ref 22–29)
Calcium: 10.5 mg/dL — ABNORMAL HIGH (ref 8.4–10.4)
Chloride: 100 mEq/L (ref 98–109)
Creatinine: 0.9 mg/dL (ref 0.6–1.1)
EGFR: 62 mL/min/{1.73_m2} — ABNORMAL LOW (ref >90)
GLUCOSE: 159 mg/dL — AB (ref 70–140)
Potassium: 4.6 mEq/L (ref 3.5–5.1)
SODIUM: 138 meq/L (ref 136–145)
TOTAL PROTEIN: 7.8 g/dL (ref 6.4–8.3)

## 2016-04-27 LAB — CBC WITH DIFFERENTIAL/PLATELET
BASO%: 0.2 % (ref 0.0–2.0)
Basophils Absolute: 0 10*3/uL (ref 0.0–0.1)
EOS ABS: 0.1 10*3/uL (ref 0.0–0.5)
EOS%: 2.4 % (ref 0.0–7.0)
HCT: 39.7 % (ref 34.8–46.6)
HEMOGLOBIN: 13.9 g/dL (ref 11.6–15.9)
LYMPH%: 39.7 % (ref 14.0–49.7)
MCH: 31.7 pg (ref 25.1–34.0)
MCHC: 35 g/dL (ref 31.5–36.0)
MCV: 90.4 fL (ref 79.5–101.0)
MONO#: 0.3 10*3/uL (ref 0.1–0.9)
MONO%: 8 % (ref 0.0–14.0)
NEUT%: 49.7 % (ref 38.4–76.8)
NEUTROS ABS: 2.1 10*3/uL (ref 1.5–6.5)
Platelets: 166 10*3/uL (ref 145–400)
RBC: 4.39 10*6/uL (ref 3.70–5.45)
RDW: 12.4 % (ref 11.2–14.5)
WBC: 4.1 10*3/uL (ref 3.9–10.3)
lymph#: 1.6 10*3/uL (ref 0.9–3.3)

## 2016-04-27 NOTE — Telephone Encounter (Signed)
Appointments scheduled per 04/27/16 los. Patient was given a copy of the appointment schedule and AVS report, per 04/27/16 los.

## 2016-10-24 DIAGNOSIS — E13319 Other specified diabetes mellitus with unspecified diabetic retinopathy without macular edema: Secondary | ICD-10-CM

## 2016-10-24 HISTORY — DX: Other specified diabetes mellitus with unspecified diabetic retinopathy without macular edema: E13.319

## 2016-11-05 ENCOUNTER — Encounter (INDEPENDENT_AMBULATORY_CARE_PROVIDER_SITE_OTHER): Payer: Medicare HMO | Admitting: Ophthalmology

## 2016-11-05 DIAGNOSIS — E11311 Type 2 diabetes mellitus with unspecified diabetic retinopathy with macular edema: Secondary | ICD-10-CM | POA: Diagnosis not present

## 2016-11-05 DIAGNOSIS — I10 Essential (primary) hypertension: Secondary | ICD-10-CM | POA: Diagnosis not present

## 2016-11-05 DIAGNOSIS — H353112 Nonexudative age-related macular degeneration, right eye, intermediate dry stage: Secondary | ICD-10-CM | POA: Diagnosis not present

## 2016-11-05 DIAGNOSIS — E113311 Type 2 diabetes mellitus with moderate nonproliferative diabetic retinopathy with macular edema, right eye: Secondary | ICD-10-CM | POA: Diagnosis not present

## 2016-11-05 DIAGNOSIS — E113212 Type 2 diabetes mellitus with mild nonproliferative diabetic retinopathy with macular edema, left eye: Secondary | ICD-10-CM | POA: Diagnosis not present

## 2016-11-05 DIAGNOSIS — H35372 Puckering of macula, left eye: Secondary | ICD-10-CM | POA: Diagnosis not present

## 2016-11-05 DIAGNOSIS — H35033 Hypertensive retinopathy, bilateral: Secondary | ICD-10-CM

## 2016-11-05 DIAGNOSIS — H43813 Vitreous degeneration, bilateral: Secondary | ICD-10-CM | POA: Diagnosis not present

## 2016-11-13 ENCOUNTER — Ambulatory Visit (INDEPENDENT_AMBULATORY_CARE_PROVIDER_SITE_OTHER): Payer: Medicare HMO | Admitting: Family Medicine

## 2016-11-13 ENCOUNTER — Encounter: Payer: Self-pay | Admitting: Family Medicine

## 2016-11-13 VITALS — BP 161/74 | HR 59 | Temp 98.2°F | Resp 18 | Ht 59.41 in | Wt 129.6 lb

## 2016-11-13 DIAGNOSIS — Z794 Long term (current) use of insulin: Secondary | ICD-10-CM | POA: Diagnosis not present

## 2016-11-13 DIAGNOSIS — I10 Essential (primary) hypertension: Secondary | ICD-10-CM

## 2016-11-13 DIAGNOSIS — IMO0001 Reserved for inherently not codable concepts without codable children: Secondary | ICD-10-CM

## 2016-11-13 DIAGNOSIS — M810 Age-related osteoporosis without current pathological fracture: Secondary | ICD-10-CM

## 2016-11-13 DIAGNOSIS — E1165 Type 2 diabetes mellitus with hyperglycemia: Secondary | ICD-10-CM

## 2016-11-13 MED ORDER — INSULIN ASPART 100 UNIT/ML ~~LOC~~ SOLN: 8.0000 [IU] | Freq: Three times a day (TID) | SUBCUTANEOUS | 5 refills | Status: DC

## 2016-11-13 MED ORDER — LOSARTAN POTASSIUM-HCTZ 100-25 MG PO TABS: 1.0000 | ORAL_TABLET | Freq: Every day | ORAL | 3 refills | Status: DC

## 2016-11-13 MED ORDER — ALENDRONATE SODIUM 70 MG PO TABS: 70.0000 mg | ORAL_TABLET | ORAL | 3 refills | Status: DC

## 2016-11-13 MED ORDER — INSULIN GLARGINE 100 UNIT/ML SOLOSTAR PEN
20.0000 [IU] | PEN_INJECTOR | Freq: Every day | SUBCUTANEOUS | 5 refills | Status: DC
Start: 2016-11-13 — End: 2016-11-29

## 2016-11-13 NOTE — Progress Notes (Signed)
8/21/201810:04 AM  Erika Campos April 19, 1940, 76 y.o. female [id number]  Chief Complaint  Patient presents with  . Establish Care    HPI:   Patient is a 76 y.o. female with past medical history significant for DM2 on insulin, HTN, Left breast cancer in remission who presents today to establish care.  Patient previously seeing PCP within a different system but changing due to language barriers. She is spanish speaking.  1. Diabetes type 2, started on insulin less than a year ago. Reports lantus avg use 20 units, novolog infrequent use of 8 units, her husband administers insulin for her. She checks her cbgs post parandial. Reports between mid 100 to low 200s. Reports low 200s infrequently. Does try to avoid simple sugars, avoids sugary drinks, tries to limit rice and bread intake.  2. Regarding HTN, meds recently changed, reports ankle edema, seems that previous PCP thoiught tt was related to amlodipine, med was stopped, hctz was increased, per patient edema still persists. She had not yet taken her BP med this morning. She did have it with her and took it during this visit. She does follow low salt diet.  3. Left breast cancer, stage 1a, sees onc once a year, in remission, s/p lumpectomy, radiation and tamoxifen. They order her yearly mammograms  4. Osteoporosis per dexa 2017, never started alendronate, does take OTC calcium and vitamin D.  Depression screen [facility] 2/9 11/13/2016 12/15/2014 12/15/2014  Decreased Interest 0 - 0  Down, Depressed, Hopeless 0 (No Data) 1  PHQ - 2 Score 0 - 1    Allergies  Allergen Reactions  . Epinephrine Other (See Comments)    panic    Current Outpatient Prescriptions on File Prior to Visit  Medication Sig Dispense Refill  . aspirin 81 MG tablet Take 81 mg by mouth every morning.     . Calcium Carbonate-Vitamin D (CALCIUM-VITAMIN D) 500-200 MG-UNIT per tablet Take 1 tablet by mouth every morning.    . ergocalciferol (VITAMIN D2) 50000 UNITS capsule  Take 50,000 Units by mouth once a week.    . metFORMIN (GLUCOPHAGE) 1000 MG tablet Take 1,000 mg by mouth 2 (two) times daily with a meal.    . amLODipine (NORVASC) 5 MG tablet Take 5 mg by mouth every morning.     . canagliflozin (INVOKANA) 300 MG TABS tablet Take 300 mg by mouth.    Marland Kitchen glipiZIDE (GLUCOTROL XL) 10 MG 24 hr tablet Take 10 mg by mouth.    . losartan-hydrochlorothiazide (HYZAAR) 50-12.5 MG per tablet Take 1 tablet by mouth every morning.     . metroNIDAZOLE (FLAGYL) 500 MG tablet Take 4 tablets (2,000 mg total) by mouth once. (Patient not taking: Reported on 11/13/2016) 4 tablet 0  . Multiple Vitamin (MULTIVITAMIN) capsule Take 1 capsule by mouth every morning.     . simvastatin (ZOCOR) 20 MG tablet Take 20 mg by mouth every evening.    . TRADJENTA 5 MG TABS tablet Take 5 mg by mouth daily.      No current facility-administered medications on file prior to visit.     Past Medical History:  Diagnosis Date  . Cancer (HCC)    breast  . Diabetes mellitus   . Hyperlipidemia   . Hypertension     Past Surgical History:  Procedure Laterality Date  . BREAST SURGERY Left   . CYSTECTOMY     lt breast  . FRACTURE SURGERY      Social History  Substance Use Topics  .  Smoking status: Never Smoker  . Smokeless tobacco: Never Used  . Alcohol use No    History reviewed. No pertinent family history.  Review of Systems  Constitutional: Negative for chills and fever.  Eyes: Negative for blurred vision and double vision.  Respiratory: Negative for cough and shortness of breath.   Cardiovascular: Positive for leg swelling. Negative for chest pain and palpitations.  Gastrointestinal: Negative for abdominal pain, nausea and vomiting.  Genitourinary: Negative for dysuria and frequency.  Neurological: Negative for tingling, speech change and focal weakness.  Endo/Heme/Allergies: Negative for polydipsia.     OBJECTIVE:  Blood pressure (!) 188/84, pulse (!) 59, temperature 98.2  F (36.8 C), temperature source Oral, resp. rate 18, height 4' 11.41" (1.509 m), weight 129 lb 9.6 oz (58.8 kg), SpO2 98 %.  Physical Exam  Constitutional: She is oriented to person, place, and time and well-developed, well-nourished, and in no distress.  HENT:  Head: Normocephalic and atraumatic.  Mouth/Throat: Oropharynx is clear and moist. No oropharyngeal exudate.  Eyes: Pupils are equal, round, and reactive to light. EOM are normal. No scleral icterus.  Neck: Neck supple.  Cardiovascular: Normal rate, regular rhythm and normal heart sounds.  Exam reveals no gallop and no friction rub.   No murmur heard. Pulmonary/Chest: Effort normal and breath sounds normal. She has no wheezes. She has no rales.  Musculoskeletal: She exhibits edema (trace pitting edema).  Neurological: She is alert and oriented to person, place, and time. Gait normal.  Skin: Skin is warm and dry.    Results for orders placed or performed in visit on 04/27/16  CBC with Differential/Platelet  Result Value Ref Range   WBC 4.1 3.9 - 10.3 10e3/uL   NEUT# 2.1 1.5 - 6.5 10e3/uL   HGB 13.9 11.6 - 15.9 g/dL   HCT 39.7 34.8 - 46.6 %   Platelets 166 145 - 400 10e3/uL   MCV 90.4 79.5 - 101.0 fL   MCH 31.7 25.1 - 34.0 pg   MCHC 35.0 31.5 - 36.0 g/dL   RBC 4.39 3.70 - 5.45 10e6/uL   RDW 12.4 11.2 - 14.5 %   lymph# 1.6 0.9 - 3.3 10e3/uL   MONO# 0.3 0.1 - 0.9 10e3/uL   Eosinophils Absolute 0.1 0.0 - 0.5 10e3/uL   Basophils Absolute 0.0 0.0 - 0.1 10e3/uL   NEUT% 49.7 38.4 - 76.8 %   LYMPH% 39.7 14.0 - 49.7 %   MONO% 8.0 0.0 - 14.0 %   EOS% 2.4 0.0 - 7.0 %   BASO% 0.2 0.0 - 2.0 %  Comprehensive metabolic panel  Result Value Ref Range   Sodium 138 136 - 145 mEq/L   Potassium 4.6 3.5 - 5.1 mEq/L   Chloride 100 98 - 109 mEq/L   CO2 30 (H) 22 - 29 mEq/L   Glucose 159 (H) 70 - 140 mg/dl   BUN 17.9 7.0 - 26.0 mg/dL   Creatinine 0.9 0.6 - 1.1 mg/dL   Total Bilirubin 0.63 0.20 - 1.20 mg/dL   Alkaline Phosphatase 114 40  - 150 U/L   AST 21 5 - 34 U/L   ALT 25 0 - 55 U/L   Total Protein 7.8 6.4 - 8.3 g/dL   Albumin 4.1 3.5 - 5.0 g/dL   Calcium 10.5 (H) 8.4 - 10.4 mg/dL   Anion Gap 8 3 - 11 mEq/L   EGFR 62 (L) >90 ml/min/1.73 m2     ASSESSMENT and PLAN:  1. Essential hypertension, benign Recheck BP better, just took  meds, discussed importance of taking meds before appt. Consider changing hctz to chlorthalidone if not at goal. - CBC with Differential - Comprehensive metabolic panel - Lipid panel - TSH - Hemoglobin A1c - Microalbumin / creatinine urine ratio  2. Uncontrolled type 2 diabetes mellitus without complication, with long-term current use of insulin (HCC) a1c to be checked today. Has upcoming appt with retinal specialist, had long discussion with her husband and patient regarding difference between lantus and novolog. Discussed lantus 20u qHS, novolog 8u AC, starting checking fasting cbg, cont checking 2 hr PP. Blank blood glucose logs provided.  Needs foot exam at next visit. - CBC with Differential - Comprehensive metabolic panel - Lipid panel - TSH - Hemoglobin A1c - Microalbumin / creatinine urine ratio  3. Osteoporosis without current pathological fracture, unspecified osteoporosis type Dexa done 01/2016, discussed treatment options. R/se/b. Decided to start fosamax. Discussed importance of continuing with OTC ca/vit D,   - alendronate (FOSAMAX) 70 MG tablet; Take 1 tablet (70 mg total) by mouth every 7 (seven) days. Take with a full glass of water on an empty stomach.  Dispense: 12 tablet; Refill: [place], MD [facility] at [place], [place] [address] Ph.  [phone] Fax [phone]

## 2016-11-13 NOTE — Patient Instructions (Signed)
     IF you received an x-ray today, you will receive an invoice from [facility]. Please contact [facility] at [phone] with questions or concerns regarding your invoice.   IF you received labwork today, you will receive an invoice from [facility]. Please contact [facility] at [phone] with questions or concerns regarding your invoice.   Our billing staff will not be able to assist you with questions regarding bills from these companies.  You will be contacted with the lab results as soon as they are available. The fastest way to get your results is to activate your My Chart account. Instructions are located on the last page of this paperwork. If you have not heard from us regarding the results in 2 weeks, please contact this office.     

## 2016-11-14 LAB — HEMOGLOBIN A1C
Est. average glucose Bld gHb Est-mCnc: 209 mg/dL
Hgb A1c MFr Bld: 8.9 % — ABNORMAL HIGH (ref 4.8–5.6)

## 2016-11-14 LAB — CBC WITH DIFFERENTIAL/PLATELET
Basophils Absolute: 0 10*3/uL (ref 0.0–0.2)
Basos: 1 %
EOS (ABSOLUTE): 0.1 10*3/uL (ref 0.0–0.4)
Eos: 2 %
Hematocrit: 39.9 % (ref 34.0–46.6)
Hemoglobin: 13.6 g/dL (ref 11.1–15.9)
Immature Grans (Abs): 0 10*3/uL (ref 0.0–0.1)
Immature Granulocytes: 0 %
Lymphocytes Absolute: 1.4 10*3/uL (ref 0.7–3.1)
Lymphs: 30 %
MCH: 31.2 pg (ref 26.6–33.0)
MCHC: 34.1 g/dL (ref 31.5–35.7)
MCV: 92 fL (ref 79–97)
Monocytes Absolute: 0.3 10*3/uL (ref 0.1–0.9)
Monocytes: 7 %
Neutrophils Absolute: 2.7 10*3/uL (ref 1.4–7.0)
Neutrophils: 60 %
Platelets: 178 10*3/uL (ref 150–379)
RBC: 4.36 x10E6/uL (ref 3.77–5.28)
RDW: 13.6 % (ref 12.3–15.4)
WBC: 4.6 10*3/uL (ref 3.4–10.8)

## 2016-11-14 LAB — COMPREHENSIVE METABOLIC PANEL
ALT: 20 IU/L (ref 0–32)
AST: 22 IU/L (ref 0–40)
Albumin/Globulin Ratio: 1.6 (ref 1.2–2.2)
Albumin: 4.4 g/dL (ref 3.5–4.8)
Alkaline Phosphatase: 96 IU/L (ref 39–117)
BUN/Creatinine Ratio: 18 (ref 12–28)
BUN: 15 mg/dL (ref 8–27)
Bilirubin Total: 0.5 mg/dL (ref 0.0–1.2)
CO2: 22 mmol/L (ref 20–29)
Calcium: 9.5 mg/dL (ref 8.7–10.3)
Chloride: 96 mmol/L (ref 96–106)
Creatinine, Ser: 0.83 mg/dL (ref 0.57–1.00)
GFR calc Af Amer: 79 mL/min/{1.73_m2} (ref >59)
GFR calc non Af Amer: 68 mL/min/{1.73_m2} (ref >59)
Globulin, Total: 2.7 g/dL (ref 1.5–4.5)
Glucose: 202 mg/dL — ABNORMAL HIGH (ref 65–99)
Potassium: 4.1 mmol/L (ref 3.5–5.2)
Sodium: 137 mmol/L (ref 134–144)
Total Protein: 7.1 g/dL (ref 6.0–8.5)

## 2016-11-14 LAB — MICROALBUMIN / CREATININE URINE RATIO
Creatinine, Urine: 32.4 mg/dL
Microalb/Creat Ratio: 20.4 mg/g creat (ref 0.0–30.0)
Microalbumin, Urine: 6.6 ug/mL

## 2016-11-14 LAB — LIPID PANEL
Chol/HDL Ratio: 3.7 ratio (ref 0.0–4.4)
Cholesterol, Total: 199 mg/dL (ref 100–199)
HDL: 54 mg/dL (ref >39)
LDL Calculated: 93 mg/dL (ref 0–99)
Triglycerides: 261 mg/dL — ABNORMAL HIGH (ref 0–149)
VLDL Cholesterol Cal: 52 mg/dL — ABNORMAL HIGH (ref 5–40)

## 2016-11-14 LAB — TSH: TSH: 2.27 u[IU]/mL (ref 0.450–4.500)

## 2016-11-15 ENCOUNTER — Encounter (INDEPENDENT_AMBULATORY_CARE_PROVIDER_SITE_OTHER): Payer: Medicare HMO | Admitting: Ophthalmology

## 2016-11-15 DIAGNOSIS — E113212 Type 2 diabetes mellitus with mild nonproliferative diabetic retinopathy with macular edema, left eye: Secondary | ICD-10-CM | POA: Diagnosis not present

## 2016-11-15 DIAGNOSIS — E11311 Type 2 diabetes mellitus with unspecified diabetic retinopathy with macular edema: Secondary | ICD-10-CM

## 2016-11-22 ENCOUNTER — Telehealth: Payer: Self-pay | Admitting: Family Medicine

## 2016-11-22 ENCOUNTER — Telehealth: Payer: Self-pay | Admitting: *Deleted

## 2016-11-22 NOTE — Telephone Encounter (Signed)
[place], clarified: novolog flex pen, 8u TID AC, 4 boxes, 90 days with 1 refill.

## 2016-11-22 NOTE — Telephone Encounter (Signed)
Forwarded to Dr. Santiago

## 2016-11-22 NOTE — Telephone Encounter (Addendum)
THIS MESSAGE IS FROM [facility] FOR DR. SANTIAGO: PRESCRIPTION WAS WRITTEN FOR INSULIN ASPART (NOVOLOG) 100 UNIT/ML INJECTION. THEY NEED TO KNOW IF SHE WANTS TO USE THE VIALS, PEN FILLS OR FLEX PENS? BEST PHONE [phone] [facility] (YOU CAN SPEAK TO [place]) ORDER #[id number]. [place]

## 2016-11-29 ENCOUNTER — Ambulatory Visit (INDEPENDENT_AMBULATORY_CARE_PROVIDER_SITE_OTHER): Payer: Medicare HMO | Admitting: Family Medicine

## 2016-11-29 ENCOUNTER — Encounter: Payer: Self-pay | Admitting: Family Medicine

## 2016-11-29 VITALS — BP 131/66 | HR 66 | Temp 97.7°F | Resp 20 | Ht 60.04 in | Wt 129.6 lb

## 2016-11-29 DIAGNOSIS — I10 Essential (primary) hypertension: Secondary | ICD-10-CM

## 2016-11-29 DIAGNOSIS — Z23 Encounter for immunization: Secondary | ICD-10-CM

## 2016-11-29 DIAGNOSIS — E1165 Type 2 diabetes mellitus with hyperglycemia: Secondary | ICD-10-CM

## 2016-11-29 DIAGNOSIS — E118 Type 2 diabetes mellitus with unspecified complications: Secondary | ICD-10-CM

## 2016-11-29 DIAGNOSIS — Z794 Long term (current) use of insulin: Secondary | ICD-10-CM

## 2016-11-29 DIAGNOSIS — IMO0001 Reserved for inherently not codable concepts without codable children: Secondary | ICD-10-CM

## 2016-11-29 DIAGNOSIS — IMO0002 Reserved for concepts with insufficient information to code with codable children: Secondary | ICD-10-CM | POA: Insufficient documentation

## 2016-11-29 MED ORDER — INSULIN GLARGINE 100 UNIT/ML SOLOSTAR PEN: 22.0000 [IU] | PEN_INJECTOR | Freq: Every day | SUBCUTANEOUS | 5 refills | Status: DC

## 2016-11-29 NOTE — Progress Notes (Signed)
9/6/201810:15 AM  Lorin Picket May 16, 1940, 76 y.o. female [id number]  Chief Complaint  Patient presents with  . Hypertension    f/u- labs results    HPI:   Patient is a 76 y.o. female with past medical history significant for HTN, DM type 2 with retinopathy, osteoporosis and left breast cancer who presents today for fu.  1. HTN - tolerating new medication well. Follows low salt diet. Does not check BP at home.  2. DM. A/p laser treatment, 10/2016. Checking cbgs fasting and before bedtime,reports fasting 136-154, reports bedtime 182-210. Using lantus 20u qhs and novolog 8u ac. Denies any hypoglycemia. Struggling with diet, high on roots and startchy veggies. Used to walk about 2 hours a day but has stopped since started caring for a friends 3yo.   Depression screen [facility] 2/9 11/29/2016 11/13/2016 12/15/2014  Decreased Interest 0 0 -  Down, Depressed, Hopeless 0 0 (No Data)  PHQ - 2 Score 0 0 -    Allergies  Allergen Reactions  . Epinephrine Other (See Comments)    panic    Current Outpatient Prescriptions on File Prior to Visit  Medication Sig Dispense Refill  . alendronate (FOSAMAX) 70 MG tablet Take 1 tablet (70 mg total) by mouth every 7 (seven) days. Take with a full glass of water on an empty stomach. 12 tablet 3  . aspirin 81 MG tablet Take 81 mg by mouth every morning.     . Calcium Carbonate-Vitamin D (CALCIUM-VITAMIN D) 500-200 MG-UNIT per tablet Take 1 tablet by mouth every morning.    . insulin aspart (NOVOLOG) 100 UNIT/ML injection Inject 8 Units into the skin 3 (three) times daily before meals. 3 vial 5  . losartan-hydrochlorothiazide (HYZAAR) 100-25 MG tablet Take 1 tablet by mouth daily. 90 tablet 3  . metFORMIN (GLUCOPHAGE) 1000 MG tablet Take 1,000 mg by mouth 2 (two) times daily with a meal.    . Multiple Vitamin (MULTIVITAMIN) capsule Take 1 capsule by mouth every morning.     . simvastatin (ZOCOR) 20 MG tablet Take 20 mg by mouth every evening.     No current  facility-administered medications on file prior to visit.     Past Medical History:  Diagnosis Date  . Cancer (HCC)    breast  . Diabetes mellitus   . Hyperlipidemia   . Hypertension     Past Surgical History:  Procedure Laterality Date  . BREAST SURGERY Left   . CYSTECTOMY     lt breast  . FRACTURE SURGERY      Social History  Substance Use Topics  . Smoking status: Never Smoker  . Smokeless tobacco: Never Used  . Alcohol use No    History reviewed. No pertinent family history.  Review of Systems  Constitutional: Negative for chills and fever.  Respiratory: Negative for cough and shortness of breath.   Cardiovascular: Negative for chest pain, palpitations and leg swelling.  Gastrointestinal: Negative for abdominal pain, nausea and vomiting.     OBJECTIVE:  Blood pressure 131/66, pulse 66, temperature 97.7 F (36.5 C), temperature source Oral, resp. rate 20, height 5' 0.04" (1.525 m), weight 129 lb 9.6 oz (58.8 kg), SpO2 97 %.  Physical Exam  Constitutional: She is oriented to person, place, and time and well-developed, well-nourished, and in no distress.  HENT:  Head: Normocephalic and atraumatic.  Mouth/Throat: Oropharynx is clear and moist. No oropharyngeal exudate.  Eyes: Pupils are equal, round, and reactive to light. EOM are normal. No scleral icterus.  Neck: Neck supple.  Cardiovascular: Normal rate, regular rhythm and normal heart sounds.  Exam reveals no gallop and no friction rub.   No murmur heard. Pulmonary/Chest: Effort normal and breath sounds normal. She has no wheezes. She has no rales.  Musculoskeletal: She exhibits no edema.  Neurological: She is alert and oriented to person, place, and time. Gait normal.  Skin: Skin is warm and dry.    Results for orders placed or performed in visit on 11/13/16  CBC with Differential  Result Value Ref Range   WBC 4.6 3.4 - 10.8 x10E3/uL   RBC 4.36 3.77 - 5.28 x10E6/uL   Hemoglobin 13.6 11.1 - 15.9 g/dL     Hematocrit 39.9 34.0 - 46.6 %   MCV 92 79 - 97 fL   MCH 31.2 26.6 - 33.0 pg   MCHC 34.1 31.5 - 35.7 g/dL   RDW 13.6 12.3 - 15.4 %   Platelets 178 150 - 379 x10E3/uL   Neutrophils 60 Not Estab. %   Lymphs 30 Not Estab. %   Monocytes 7 Not Estab. %   Eos 2 Not Estab. %   Basos 1 Not Estab. %   Neutrophils Absolute 2.7 1.4 - 7.0 x10E3/uL   Lymphocytes Absolute 1.4 0.7 - 3.1 x10E3/uL   Monocytes Absolute 0.3 0.1 - 0.9 x10E3/uL   EOS (ABSOLUTE) 0.1 0.0 - 0.4 x10E3/uL   Basophils Absolute 0.0 0.0 - 0.2 x10E3/uL   Immature Granulocytes 0 Not Estab. %   Immature Grans (Abs) 0.0 0.0 - 0.1 x10E3/uL  Comprehensive metabolic panel  Result Value Ref Range   Glucose 202 (H) 65 - 99 mg/dL   BUN 15 8 - 27 mg/dL   Creatinine, Ser 0.83 0.57 - 1.00 mg/dL   GFR calc non Af Amer 68 >59 mL/min/1.73   GFR calc Af Amer 79 >59 mL/min/1.73   BUN/Creatinine Ratio 18 12 - 28   Sodium 137 134 - 144 mmol/L   Potassium 4.1 3.5 - 5.2 mmol/L   Chloride 96 96 - 106 mmol/L   CO2 22 20 - 29 mmol/L   Calcium 9.5 8.7 - 10.3 mg/dL   Total Protein 7.1 6.0 - 8.5 g/dL   Albumin 4.4 3.5 - 4.8 g/dL   Globulin, Total 2.7 1.5 - 4.5 g/dL   Albumin/Globulin Ratio 1.6 1.2 - 2.2   Bilirubin Total 0.5 0.0 - 1.2 mg/dL   Alkaline Phosphatase 96 39 - 117 IU/L   AST 22 0 - 40 IU/L   ALT 20 0 - 32 IU/L  Lipid panel  Result Value Ref Range   Cholesterol, Total 199 100 - 199 mg/dL   Triglycerides 261 (H) 0 - 149 mg/dL   HDL 54 >39 mg/dL   VLDL Cholesterol Cal 52 (H) 5 - 40 mg/dL   LDL Calculated 93 0 - 99 mg/dL   Chol/HDL Ratio 3.7 0.0 - 4.4 ratio  TSH  Result Value Ref Range   TSH 2.270 0.450 - 4.500 uIU/mL  Hemoglobin A1c  Result Value Ref Range   Hgb A1c MFr Bld 8.9 (H) 4.8 - 5.6 %   Est. average glucose Bld gHb Est-mCnc 209 mg/dL  Microalbumin / creatinine urine ratio  Result Value Ref Range   Creatinine, Urine 32.4 Not Estab. mg/dL   Albumin, Urine 6.6 Not Estab. ug/mL   Microalb/Creat Ratio 20.4 0.0 - 30.0  mg/g creat     ASSESSMENT and PLAN:  Problem List Items Addressed This Visit    Uncontrolled type 2 diabetes mellitus without  complication, with long-term current use of insulin ([place]) - Primary    Had long discussion regarding diet and exercise Encouraged patient to start walking again Discussed cbgs goals Increase lantus to 22units qhs Cont with checking cbgs fasting and bedtime      Relevant Medications   Insulin Glargine (LANTUS SOLOSTAR) 100 UNIT/ML Solostar Pen   Other Relevant Orders   HM Diabetes Foot Exam (Completed)   Essential hypertension, benign    Stable, cont with current regime       Other Visit Diagnoses    Need for immunization against influenza       Relevant Orders   Flu Vaccine QUAD 36+ mos IM (Completed)           Rutherford Guys, MD Primary Care at [place], [place] [address] Ph.  [phone] Fax [phone]

## 2016-11-29 NOTE — Assessment & Plan Note (Signed)
Had long discussion regarding diet and exercise Encouraged patient to start walking again Discussed cbgs goals Increase lantus to 22units qhs Cont with checking cbgs fasting and bedtime

## 2016-11-29 NOTE — Patient Instructions (Addendum)
1. azucar en ayuna: 90-130 2. azucar antes de dormir: 160-180 3. aumentar lantus a 22 unidades una vez al dia 4. caminar 4-5 veces a la semana 5. reducir cantidad de carbohidratos consumidos  Recuento de carbohidratos para la diabetes mellitus en los adultos Carbohydrate Counting for Diabetes Mellitus, Adult El recuento de carbohidratos es un mtodo destinado a Catering manager un registro de la cantidad de carbohidratos que se ingieren. La ingesta natural de carbohidratos aumenta la cantidad de azcar (glucosa) en la sangre. El recuento de la cantidad de carbohidratos que se ingieren sirve para que el nivel de glucosa sangunea (glucemia) permanezca dentro de los lmites normales, lo que ayuda a Theatre manager la diabetes (diabetes mellitus) bajo control. Es importante saber la cantidad de carbohidratos que se pueden ingerir en cada comida sin correr Engineer, manufacturing. Esto es Psychologist, forensic. Un especialista en alimentacin y nutricin (nutricionista certificado) puede ayudarlo a crear un plan de alimentacin y a calcular la cantidad de carbohidratos que debe ingerir en cada comida y colacin. Los siguientes alimentos incluyen carbohidratos:  Granos, como panes y cereales.  Frijoles secos y productos con soja.  Verduras con almidn, como papas, guisantes y maz.  Lambert Mody y jugos de frutas.  Leche y Estate agent.  Dulces y colaciones, como pasteles, galletas, caramelos, papas fritas y refrescos.  Cmo se calculan los carbohidratos? Hay dos maneras de calcular los carbohidratos de los alimentos. Puede usar cualquiera de los dos mtodos o [place] combinacin de [place]. Leer la etiqueta de informacin nutricional de los alimentos envasados La lista de informacin nutricional est incluida en las etiquetas de casi todas las bebidas y los alimentos envasados de los [place]. Incluye lo siguiente:  El tamao de la porcin.  Informacin sobre los nutrientes de cada porcin, incluidos los gramos (g) de  carbohidratos por porcin.  Para usar la informacin nutricional:  Decida cuntas porciones va a comer.  Multiplique la cantidad de porciones por el nmero de carbohidratos por porcin.  El resultado es la cantidad total de carbohidratos que comer.  Conocer los tamaos de las porciones estndar de otros alimentos Cuando coma alimentos que contienen carbohidratos que no estn envasados o no incluyen la informacin nutricional en la etiqueta, debe medir las porciones para poder calcular la cantidad de carbohidratos:  Mida los alimentos que comer con una balanza de alimentos o una taza medidora, si es necesario.  Decida cuntas porciones de International aid/development worker.  Multiplique el nmero de porciones por15. La mayora de los alimentos con alto contenido de carbohidratos contienen unos 15g de carbohidratos por porcin. ? Por ejemplo, si come 8onzas (170g) de fresas, habr comido 2porciones y 30g de carbohidratos (2porciones x 15g=30g).  En el caso de las comidas que contienen mezclas de ms de un alimento, como las sopas y los guisos, debe calcular los carbohidratos de cada alimento que se incluye.  La siguiente [employer] tamaos de las porciones estndar de los alimentos corrientes con alto contenido de carbohidratos. Cada una de estas porciones tiene aproximadamente 15g de carbohidratos:  pan de hamburguesa o muffin ingls.  onza (65ml) de almbar.  onza (14g) de gelatina.  1rebanada de pan.  1tortilla de seispulgadas.  3onzas (85g) de arroz o pasta cocidos.  4onzas (113g) de frijoles secos cocidos.  4onzas (113g) de verduras con almidn, como guisantes, maz o papas.  4onzas (113g) de cereal caliente.  4onzas (113g) de pur de papas o de una papa grande al horno.  4onzas (113g) de frutas en lata o  congeladas.  4onzas (118ml) de jugo de frutas.  4a 6galletas.  6croquetas de pollo.  6onzas (170g) de cereales secos  sin azcar.  6onzas (170g) de yogur descremado sin ningn agregado o de yogur endulzado con edulcorante artificial.  8onzas (242ml) de [place].  8onzas (170g) de frutas frescas o una fruta pequea.  24onzas (680g) de palomitas de maz.  Ejemplo de recuento de carbohidratos Ejemplo de comida  3onzas (85g) de pechuga de pollo.  6onzas (170g) de arroz integral.  4onzas (113g) de maz.  8onzas (268ml) de leche.  8onzas (170g) de fresas con crema batida sin azcar. Clculo de carbohidratos 1. Identifique los alimentos que contienen carbohidratos: ? Arroz. ? Maz. ? Leche. ? Hughie Closs. 2. Calcule cuntas porciones come de cada alimento: ? 2 porciones de arroz. ? 1 porcin de maz. ? 1 porcin de leche. ? 1 porcin de fresas. 3. Multiplique cada nmero de porciones por 15g: ? 2 porciones de arroz x 15 g = 30 g. ? 1 porcin de maz x 15 g = 15 g. ? 1 porcin de leche x 15 g = 15 g. ? 1 porcin de fresas x 15 g = 15 g. 4. Sume todas las cantidades para conocer el total de gramos de carbohidratos consumidos: ? 30g + 15g + 15g + 15g = 75g de carbohidratos en total. Esta informacin no tiene como fin reemplazar el consejo del mdico. Asegrese de hacerle al mdico cualquier pregunta que tenga. Document Released: 06/04/2011 Document Revised: 02/28/2016 Document Reviewed: 08/24/2015 Elsevier Interactive Patient Education  2018 [employer].     IF you received an x-ray today, you will receive an invoice from [facility]. Please contact [facility] at [phone] with questions or concerns regarding your invoice.   IF you received labwork today, you will receive an invoice from [place]. Please contact [facility] at [phone] with questions or concerns regarding your invoice.   Our billing staff will not be able to assist you with questions regarding bills from these companies.  You will be contacted with the lab results as soon as they  are available. The fastest way to get your results is to activate your My Chart account. Instructions are located on the last page of this paperwork. If you have not heard from [place] regarding the results in 2 weeks, please contact this office.

## 2016-11-29 NOTE — Assessment & Plan Note (Signed)
Stable, cont with current regime

## 2016-12-31 ENCOUNTER — Other Ambulatory Visit: Payer: Self-pay | Admitting: Family Medicine

## 2017-01-08 ENCOUNTER — Other Ambulatory Visit: Payer: Self-pay | Admitting: Hematology

## 2017-01-08 DIAGNOSIS — Z1231 Encounter for screening mammogram for malignant neoplasm of breast: Secondary | ICD-10-CM

## 2017-02-01 ENCOUNTER — Telehealth: Payer: Self-pay | Admitting: Family Medicine

## 2017-02-01 NOTE — Telephone Encounter (Signed)
Copied from [place] [id number]. Topic: Quick Communication - See Telephone Encounter >> Feb 01, 2017  4:12 PM Ebony Hail wrote: CRM for notification. See Telephone encounter for: Fax sent 01/25/17 requesting Diabetic testing supplies Accu-check Avia, soft-click lancet, test strips  Please follow insturctions on 11/02's fax or call [phone]  02/01/17.

## 2017-02-04 NOTE — Telephone Encounter (Signed)
Called in prescription

## 2017-02-04 NOTE — Telephone Encounter (Signed)
Pt needs diabetic supplies

## 2017-02-18 ENCOUNTER — Ambulatory Visit
Admission: RE | Admit: 2017-02-18 | Discharge: 2017-02-18 | Disposition: A | Discharge status: Home / Self Care | Payer: Medicare HMO | Source: Ambulatory Visit | Attending: Hematology | Admitting: Hematology

## 2017-02-18 DIAGNOSIS — Z1231 Encounter for screening mammogram for malignant neoplasm of breast: Secondary | ICD-10-CM

## 2017-02-18 LAB — HM MAMMOGRAPHY

## 2017-02-28 ENCOUNTER — Encounter: Payer: Self-pay | Admitting: Family Medicine

## 2017-02-28 ENCOUNTER — Ambulatory Visit: Payer: Medicare HMO | Admitting: Family Medicine

## 2017-02-28 ENCOUNTER — Other Ambulatory Visit: Payer: Self-pay

## 2017-02-28 VITALS — BP 140/72 | HR 61 | Temp 98.2°F | Wt 130.0 lb

## 2017-02-28 DIAGNOSIS — Z794 Long term (current) use of insulin: Secondary | ICD-10-CM

## 2017-02-28 DIAGNOSIS — IMO0001 Reserved for inherently not codable concepts without codable children: Secondary | ICD-10-CM

## 2017-02-28 DIAGNOSIS — Z23 Encounter for immunization: Secondary | ICD-10-CM

## 2017-02-28 DIAGNOSIS — I10 Essential (primary) hypertension: Secondary | ICD-10-CM | POA: Diagnosis not present

## 2017-02-28 DIAGNOSIS — M810 Age-related osteoporosis without current pathological fracture: Secondary | ICD-10-CM | POA: Diagnosis not present

## 2017-02-28 DIAGNOSIS — E1165 Type 2 diabetes mellitus with hyperglycemia: Secondary | ICD-10-CM

## 2017-02-28 DIAGNOSIS — E785 Hyperlipidemia, unspecified: Secondary | ICD-10-CM

## 2017-02-28 LAB — POCT URINALYSIS DIP (MANUAL ENTRY)
Bilirubin, UA: NEGATIVE
Glucose, UA: NEGATIVE mg/dL
Ketones, POC UA: NEGATIVE mg/dL
Nitrite, UA: NEGATIVE
Protein Ur, POC: NEGATIVE mg/dL
Spec Grav, UA: 1.015 (ref 1.010–1.025)
Urobilinogen, UA: 0.2 E.U./dL
pH, UA: 7 (ref 5.0–8.0)

## 2017-02-28 MED ORDER — LOSARTAN POTASSIUM-HCTZ 100-25 MG PO TABS: 1.0000 | ORAL_TABLET | Freq: Every day | ORAL | 3 refills | Status: DC

## 2017-02-28 MED ORDER — SIMVASTATIN 20 MG PO TABS: 20.0000 mg | ORAL_TABLET | Freq: Every evening | ORAL | 3 refills | Status: DC

## 2017-02-28 MED ORDER — ALENDRONATE SODIUM 70 MG PO TABS: 70.0000 mg | ORAL_TABLET | ORAL | 3 refills | Status: DC

## 2017-02-28 MED ORDER — METFORMIN HCL 1000 MG PO TABS: 1000.0000 mg | ORAL_TABLET | Freq: Two times a day (BID) | ORAL | 3 refills | Status: DC

## 2017-02-28 MED ORDER — INSULIN GLARGINE 100 UNIT/ML SOLOSTAR PEN: 22.0000 [IU] | PEN_INJECTOR | Freq: Every day | SUBCUTANEOUS | 5 refills | Status: DC

## 2017-02-28 MED ORDER — INSULIN ASPART 100 UNIT/ML FLEXPEN: 8.0000 [IU] | PEN_INJECTOR | Freq: Three times a day (TID) | SUBCUTANEOUS | 11 refills | Status: DC

## 2017-02-28 MED ORDER — CALCIUM-VITAMIN D 500-200 MG-UNIT PO TABS: 1.0000 | ORAL_TABLET | Freq: Every morning | ORAL | 3 refills | Status: AC

## 2017-02-28 MED ORDER — "PEN NEEDLES 5/16"" 30G X 8 MM MISC": 1.0000 | Freq: Three times a day (TID) | 1 refills | Status: DC

## 2017-02-28 MED ORDER — BLOOD GLUCOSE MONITOR KIT: PACK | 0 refills | Status: DC

## 2017-02-28 NOTE — Progress Notes (Signed)
12/6/201810:19 AM  Erika Campos 06-Jul-1940, 76 y.o. female [id number]  Chief Complaint  Patient presents with  . Diabetes    follow up    HPI:   Patient is a 76 y.o. female with past medical history significant for HTN, DM type 2 with retinopathy, hyperlipidemia, osteoporosis and left breast cancer who presents today for fu. Last seen 10/2016.  1. HTN - tolerating medication well. Follows low salt diet. Does not check BP at home.  2. DM. A/p laser treatment, 10/2016. Sees optho twice a year. Checking cbgs fasting and before bedtime, reports fasting yesterday 222, today 178.  Using lantus 20u qhs and novolog 8u ac.  Denies any hypoglycemia. Struggling with diet, likes to snack on fruit at night.  Last a1c 8.9, neg urine microalbuminuria (10/2016), normal crt (10/2016) Foot exam, normal (11/2016)  3. Hyperlipidemia. Tolerating medication well. LDL 93 (10/2016)  4. Breast cancer - had birads 1 mammogram 02/2017  5. Osteoporosis per dexa 2017, started alendronate 10/2016, tolerating well, does take OTC calcium and vitamin D.  Needs refills of medications and new glucose testing kit/supplies  Depression screen [facility] 2/9 02/28/2017 11/29/2016 11/13/2016  Decreased Interest 0 0 0  Down, Depressed, Hopeless 0 0 0  PHQ - 2 Score 0 0 0    Allergies  Allergen Reactions  . Epinephrine Other (See Comments)    panic    Prior to Admission medications   Medication Sig Start Date End Date Taking? Authorizing Provider  alendronate (FOSAMAX) 70 MG tablet Take 1 tablet (70 mg total) by mouth every 7 (seven) days. Take with a full glass of water on an empty stomach. 02/28/17  Yes Rutherford Guys, MD  aspirin 81 MG tablet Take 81 mg by mouth every morning.    Yes [provider]  Calcium Carb-Cholecalciferol (CALCIUM-VITAMIN D) 500-200 MG-UNIT tablet Take 1 tablet by mouth every morning. 02/28/17  Yes Rutherford Guys, MD  Insulin Glargine (LANTUS SOLOSTAR) 100 UNIT/ML Solostar Pen Inject 22  Units into the skin daily at 10 pm. 02/28/17  Yes Rutherford Guys, MD  losartan-hydrochlorothiazide (HYZAAR) 100-25 MG tablet Take 1 tablet by mouth daily. 02/28/17  Yes Rutherford Guys, MD  metFORMIN (GLUCOPHAGE) 1000 MG tablet Take 1 tablet (1,000 mg total) by mouth 2 (two) times daily with a meal. 02/28/17  Yes Rutherford Guys, MD  Multiple Vitamin (MULTIVITAMIN) capsule Take 1 capsule by mouth every morning.    Yes [provider]  simvastatin (ZOCOR) 20 MG tablet Take 1 tablet (20 mg total) by mouth every evening. 02/28/17  Yes Rutherford Guys, MD  blood glucose meter kit and supplies KIT Dispense based on patient and insurance preference. Check blood glucose three times a day. Dx E11.9, z79.4 02/28/17   Rutherford Guys, MD  insulin aspart (NOVOLOG) 100 UNIT/ML FlexPen Inject 8 Units into the skin 3 (three) times daily with meals. 02/28/17   Rutherford Guys, MD  Insulin Pen Needle (PEN NEEDLES 5/16") 30G X 8 MM MISC 1 each by Does not apply route 4 (four) times daily -  before meals and at bedtime. 02/28/17   Rutherford Guys, MD    Past Medical History:  Diagnosis Date  . Cancer (HCC)    breast  . Diabetes mellitus   . Hyperlipidemia   . Hypertension     Past Surgical History:  Procedure Laterality Date  . BREAST SURGERY Left   . CYSTECTOMY     lt breast  .  FRACTURE SURGERY      Social History   Tobacco Use  . Smoking status: Never Smoker  . Smokeless tobacco: Never Used  Substance Use Topics  . Alcohol use: No    Family History  Problem Relation Age of Onset  . Breast cancer Neg Hx     Review of Systems  Constitutional: Negative for chills and fever.  Respiratory: Negative for cough and shortness of breath.   Cardiovascular: Negative for chest pain, palpitations and leg swelling.  Gastrointestinal: Negative for abdominal pain, nausea and vomiting.  Genitourinary: Negative for frequency.  Endo/Heme/Allergies: Negative for polydipsia.      OBJECTIVE:  Blood pressure 140/72, pulse 61, temperature 98.2 F (36.8 C), temperature source Oral, weight 130 lb (59 kg), SpO2 97 %.  Physical Exam  Constitutional: She is oriented to person, place, and time and well-developed, well-nourished, and in no distress.  HENT:  Head: Normocephalic and atraumatic.  Mouth/Throat: Oropharynx is clear and moist. No oropharyngeal exudate.  Eyes: EOM are normal. Pupils are equal, round, and reactive to light. No scleral icterus.  Neck: Neck supple.  Cardiovascular: Normal rate, regular rhythm and normal heart sounds. Exam reveals no gallop and no friction rub.  No murmur heard. Pulmonary/Chest: Effort normal and breath sounds normal. She has no wheezes. She has no rales.  Musculoskeletal: She exhibits no edema.  Neurological: She is alert and oriented to person, place, and time. Gait normal.  Skin: Skin is warm and dry.    Results for orders placed or performed in visit on 02/28/17 (from the past 24 hour(s))  POCT urinalysis dipstick     Status: Abnormal   Collection Time: 02/28/17 10:12 AM  Result Value Ref Range   Color, UA yellow yellow   Clarity, UA clear clear   Glucose, UA negative negative mg/dL   Bilirubin, UA negative negative   Ketones, POC UA negative negative mg/dL   Spec Grav, UA 1.015 1.010 - 1.025   Blood, UA trace-intact (A) negative   pH, UA 7.0 5.0 - 8.0   Protein Ur, POC negative negative mg/dL   Urobilinogen, UA 0.2 0.2 or 1.0 E.U./dL   Nitrite, UA Negative Negative   Leukocytes, UA Trace (A) Negative      ASSESSMENT and PLAN  1. Uncontrolled type 2 diabetes mellitus without complication, with long-term current use of insulin (HCC) Checking a1c today, Given age a1c ~ 8.0 is reasonable. Discussed again low carb diet and increasing exercise. Consider adding DDP4 if needed.  - POCT urinalysis dipstick - Hemoglobin A1c  2. Essential hypertension, benign At goal, cont current med  3. Need for  vaccination Tdap given today. Need to inquire regarding pneumovax at next visit  4. Osteoporosis without current pathological fracture, unspecified osteoporosis type Tolerating meds well. Cont current regime. Repeat dexa in 2019  5. Hyperlipidemia, unspecified hyperlipidemia type Controlled, cont current med  Other orders - alendronate (FOSAMAX) 70 MG tablet; Take 1 tablet (70 mg total) by mouth every 7 (seven) days. Take with a full glass of water on an empty stomach. - Td vaccine greater than or equal to 7yo preservative free IM - Calcium Carb-Cholecalciferol (CALCIUM-VITAMIN D) 500-200 MG-UNIT tablet; Take 1 tablet by mouth every morning. - Insulin Glargine (LANTUS SOLOSTAR) 100 UNIT/ML Solostar Pen; Inject 22 Units into the skin daily at 10 pm. - losartan-hydrochlorothiazide (HYZAAR) 100-25 MG tablet; Take 1 tablet by mouth daily. - metFORMIN (GLUCOPHAGE) 1000 MG tablet; Take 1 tablet (1,000 mg total) by mouth 2 (two) times daily  with a meal. - simvastatin (ZOCOR) 20 MG tablet; Take 1 tablet (20 mg total) by mouth every evening. - insulin aspart (NOVOLOG) 100 UNIT/ML FlexPen; Inject 8 Units into the skin 3 (three) times daily with meals. - Insulin Pen Needle (PEN NEEDLES 5/16") 30G X 8 MM MISC; 1 each by Does not apply route 4 (four) times daily -  before meals and at bedtime. - blood glucose meter kit and supplies KIT; Dispense based on patient and insurance preference. Check blood glucose three times a day. Dx E11.9, z79.4  Return in about 3 months (around 05/29/2017).    Rutherford Guys, MD [facility] [place], [place] [address] Ph.  [phone] Fax [phone]

## 2017-02-28 NOTE — Patient Instructions (Signed)
     IF you received an x-ray today, you will receive an invoice from [facility]. Please contact [facility] at [phone] with questions or concerns regarding your invoice.   IF you received labwork today, you will receive an invoice from [facility]. Please contact [facility] at [phone] with questions or concerns regarding your invoice.   Our billing staff will not be able to assist you with questions regarding bills from these companies.  You will be contacted with the lab results as soon as they are available. The fastest way to get your results is to activate your My Chart account. Instructions are located on the last page of this paperwork. If you have not heard from us regarding the results in 2 weeks, please contact this office.     

## 2017-03-01 LAB — HEMOGLOBIN A1C
Est. average glucose Bld gHb Est-mCnc: 212 mg/dL
Hgb A1c MFr Bld: 9 % — ABNORMAL HIGH (ref 4.8–5.6)

## 2017-03-08 ENCOUNTER — Encounter: Payer: Self-pay | Admitting: Family Medicine

## 2017-03-25 ENCOUNTER — Telehealth: Payer: Self-pay

## 2017-03-25 NOTE — Telephone Encounter (Signed)
Copied from [place] [id number]. Topic: Quick Communication - Rx Refill/Question >> Mar 25, 2017 10:07 AM Scherrie Gerlach wrote: Pt wants to know why she was prescribed the alendronate (FOSAMAX) 70 MG tablet.  She wants Dr Pamella Pert because she speaks spanish

## 2017-03-27 ENCOUNTER — Encounter (INDEPENDENT_AMBULATORY_CARE_PROVIDER_SITE_OTHER): Payer: Medicare HMO | Admitting: Ophthalmology

## 2017-03-29 NOTE — Telephone Encounter (Signed)
Please let her know that her alendronate is for her bones as she has osteoporosis. thanks

## 2017-04-01 NOTE — Telephone Encounter (Signed)
Pt advised. Pt was asking about vitamin D.

## 2017-04-24 NOTE — Progress Notes (Signed)
Erika Campos OFFICE PROGRESS NOTE  Rutherford Guys, MD [place] [address]  DIAGNOSIS: Left breast cancer, stage IA  PROBLEM LIST:  1. 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 Estrogen receptor was 84%, progesterone receptor 95%. HER-2/neu was negative by [facility]. Proliferative fraction was 9%. Left axillary lymph node dissection revealed that all 15 lymph nodes were negative. Tumor stage was T1a N0. Axillary dissection was carried out on 01/16/2008. 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 The patient received radiation treatments to the left breast. 6000 cGy in 30 fractions from 02/11/2008 through 03/29/2008. The patient finished 5 year adjuvant tamoxifen from April 13, 2008 to 07/31/2013 2. Diabetes mellitus type 2, diagnosed in the early mid 1990s.  3. Hypertension, diagnosed about 2000.  4. Dyslipidemia.  5. Osteoporosis.  6. Mild lymphedema of the left arm following axillary dissection and radiation treatments.  7. History of an irregular pulse.  8. History of bladder polyps, status post cystoscopy by a urologist in [facility] in late August 2013. The polyps apparently were benign.   MEDICAL HISTORY: Past Medical History:  Diagnosis Date  . Cancer (HCC)    breast  . Diabetes mellitus   . Hyperlipidemia   . Hypertension    CURRENT THERAPY: Surveillance   INTERIM HISTORY: Erika Campos is here for follow up. She reports she is doing well overall. She notes her blood glucose has been high lately, and she started insulin injections.  She does check her fingersticks and levels are usually around 200. She knows she has some room to improve. She reports nothing nothing new at this time.  She states she sees her PCP every 3 months.   On review of systems, pt denies any new pain, or any other complaints at this time. Pertinent positives are listed and detailed within the above HPI.     ALLERGIES:  is allergic to epinephrine.  MEDICATIONS: has a current medication list which includes the following prescription(s): amlodipine, aspirin, calcium-vitamin d, canagliflozin, ergocalciferol, glipizide, losartan-hydrochlorothiazide, metformin, metronidazole, multivitamin, simvastatin, and tradjenta.  SURGICAL HISTORY:  Past Surgical History:  Procedure Laterality Date  . BREAST SURGERY Left   . CYSTECTOMY     lt breast  . FRACTURE SURGERY       REVIEW OF SYSTEMS:   Constitutional: Denies fevers, chills or abnormal weight loss Eyes: Denies blurriness of vision Ears, nose, mouth, throat, and face: Denies mucositis or sore throat Respiratory: Denies cough, dyspnea or wheezes Cardiovascular: Denies palpitation, chest discomfort or lower extremity swelling Gastrointestinal:  Denies nausea, heartburn or change in bowel habits Skin: Denies abnormal skin rashes Lymphatics: Denies new lymphadenopathy or easy bruising Neurological:Denies numbness, tingling or new weaknesses Behavioral/Psych: Mood is stable, no new changes  All other systems were reviewed with the patient and are negative.  PHYSICAL EXAMINATION: ECOG PERFORMANCE STATUS: 0 - Asymptomatic  Blood pressure (!) 165/68, pulse 60, temperature 98 F (36.7 C), temperature source Oral, resp. rate 20, height 5' (1.524 m), weight 129 lb 14.4 oz (58.9 kg), SpO2 100 %.  GENERAL:alert, no distress and comfortable: elderly female who appears her stated age.  SKIN: skin color, texture, turgor are normal, no rashes or significant lesions EYES: normal, Conjunctiva are pink and non-injected,  sclera clear OROPHARYNX:no exudate, no erythema and lips, buccal mucosa, and tongue normal  NECK: supple, thyroid normal size, non-tender,  without nodularity LYMPH:  no palpable lymphadenopathy in the cervical, axillary or supraclavicular LUNGS: clear to auscultation and percussion with normal breathing effort HEART: regular rate & rhythm and no murmurs and no lower extremity edema ABDOMEN:abdomen soft, non-tender and normal bowel sounds BREASTS: Right breast without masses or nipple discharge; L breast with scarring beneath armpits s/p lymph node removals, breast without masses or nipple discharge or retraction.   Musculoskeletal:no cyanosis of digits and no clubbing  NEURO: alert & oriented x 3 with fluent speech, no focal motor/sensory deficits   LABORATORY DATA: CBC Latest Ref Rng & Units 04/26/2017 11/13/2016 04/27/2016  WBC 3.9 - 10.3 K/uL 4.5 4.6 4.1  Hemoglobin 11.6 - 15.9 g/dL 13.8 13.6 13.9  Hematocrit 34.8 - 46.6 % 41.6 39.9 39.7  Platelets 145 - 400 K/uL 162 178 166    CMP Latest Ref Rng & Units 04/26/2017 11/13/2016 04/27/2016  Glucose 70 - 140 mg/dL 214(H) 202(H) 159(H)  BUN 7 - 26 mg/dL 19 15 17.9  Creatinine 0.60 - 1.10 mg/dL 1.06 0.83 0.9  Sodium 136 - 145 mmol/L 137 137 138  Potassium 3.5 - 5.1 mmol/L 3.8 4.1 4.6  Chloride 98 - 109 mmol/L 97(L) 96 -  CO2 22 - 29 mmol/L 29 22 30(H)  Calcium 8.4 - 10.4 mg/dL 10.1 9.5 10.5(H)  Total Protein 6.4 - 8.3 g/dL 7.8 7.1 7.8  Total Bilirubin 0.2 - 1.2 mg/dL 0.7 0.5 0.63  Alkaline Phos 40 - 150 U/L 89 96 114  AST 5 - 34 U/L '24 22 21  '$ ALT 0 - 55 U/L '25 20 25     '$ RADIOGRAPHIC STUDIES:  Screening Mammogram 02/18/17 IMPRESSION: No mammographic evidence of malignancy.  DEXA 02/14/16 ASSESSMENT: The BMD measured at AP Spine L1-L4 (L3) is 0.856 g/cm2 with a T-score of -2.7.  Screening Mammogram 02/14/16 IMPRESSION: No mammographic evidence of malignancy.  Diagnostic Mammogram 01/19/2015 IMPRESSION: No evidence of malignancy in the right breast.    ASSESSMENT: Erika Campos 77 y.o. 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   PLAN:  1. History of stage Ia left breast cancer, ER+/PR+/HER2- -She has completed 5 years adjuvant tamoxifen. Given her very early stage disease, I do not think she needs extended hormonal therapy. -She is clinically doing very well. Recent bilateral mammogram was negative for evidence of malignancy. Today's physical exam was unremarkable. Lab is normal. -She will continue mammogram once a year -Patient prefers to continue surveillance with [place], I'll see her back in one year -I encouraged her to be physically active, and have healthy diet -Her mammogram from 02/18/17 showed no eveidence of malignancy, her exam was unremarkable, labs today (04/26/17) are WNL. No clinical concern for recurrence -F/u in 1 year  2. Cancer screening.  -The patient had a colonoscopy carried out in [place] in 2006 that apparently was negative.  -Her primary care physician has stopped her Pap smear due to her age.  3. Osteoporosis --Repeated bone density showed osteoporosis, T score -2.7, slightly worse than 2 years ago.  -She is on Alendronate, tolerating well.  She will continue calcium carbonate-Vitamin D.    4. DM and HTN -DM not very well controlled, on insulin now  -f/u with PCP   Follow-up. - F/u in 1 year with lab -Mammogram in Nov/2019  All questions were answered. The patient knows  to call the clinic with any problems, questions or concerns. We can certainly see the patient much sooner if necessary.  I spent 20 minutes counseling the patient face to face. The total time spent in the appointment was 25 minutes.  This document serves as a record of services personally performed by Truitt Merle, MD. It was created on her behalf by Theresia Bough, a trained medical scribe. The creation of this record is based on the scribe's personal observations and the provider's statements to them.   I have reviewed the above documentation for accuracy and completeness, and I agree with the above.     Truitt Merle, MD 04/26/17 9:47 AM

## 2017-04-26 ENCOUNTER — Encounter: Payer: Self-pay | Admitting: Hematology

## 2017-04-26 ENCOUNTER — Inpatient Hospital Stay: Payer: Medicare HMO

## 2017-04-26 ENCOUNTER — Telehealth: Payer: Self-pay | Admitting: Hematology

## 2017-04-26 ENCOUNTER — Inpatient Hospital Stay: Payer: Medicare HMO | Attending: Hematology | Admitting: Hematology

## 2017-04-26 VITALS — BP 165/68 | HR 60 | Temp 98.0°F | Resp 20 | Ht 60.0 in | Wt 129.9 lb

## 2017-04-26 DIAGNOSIS — Z79899 Other long term (current) drug therapy: Secondary | ICD-10-CM

## 2017-04-26 DIAGNOSIS — Z794 Long term (current) use of insulin: Secondary | ICD-10-CM | POA: Diagnosis not present

## 2017-04-26 DIAGNOSIS — E119 Type 2 diabetes mellitus without complications: Secondary | ICD-10-CM | POA: Diagnosis not present

## 2017-04-26 DIAGNOSIS — I890 Lymphedema, not elsewhere classified: Secondary | ICD-10-CM

## 2017-04-26 DIAGNOSIS — Z7982 Long term (current) use of aspirin: Secondary | ICD-10-CM

## 2017-04-26 DIAGNOSIS — I10 Essential (primary) hypertension: Secondary | ICD-10-CM

## 2017-04-26 DIAGNOSIS — M810 Age-related osteoporosis without current pathological fracture: Secondary | ICD-10-CM | POA: Diagnosis not present

## 2017-04-26 DIAGNOSIS — Z853 Personal history of malignant neoplasm of breast: Secondary | ICD-10-CM

## 2017-04-26 LAB — COMPREHENSIVE METABOLIC PANEL
ALBUMIN: 3.9 g/dL (ref 3.5–5.0)
ALK PHOS: 89 U/L (ref 40–150)
ALT: 25 U/L (ref 0–55)
ANION GAP: 11 (ref 3–11)
AST: 24 U/L (ref 5–34)
BUN: 19 mg/dL (ref 7–26)
CALCIUM: 10.1 mg/dL (ref 8.4–10.4)
CHLORIDE: 97 mmol/L — AB (ref 98–109)
CO2: 29 mmol/L (ref 22–29)
Creatinine, Ser: 1.06 mg/dL (ref 0.60–1.10)
GFR calc Af Amer: 57 mL/min — ABNORMAL LOW (ref >60)
GFR calc non Af Amer: 49 mL/min — ABNORMAL LOW (ref >60)
GLUCOSE: 214 mg/dL — AB (ref 70–140)
Potassium: 3.8 mmol/L (ref 3.5–5.1)
SODIUM: 137 mmol/L (ref 136–145)
Total Bilirubin: 0.7 mg/dL (ref 0.2–1.2)
Total Protein: 7.8 g/dL (ref 6.4–8.3)

## 2017-04-26 LAB — CBC WITH DIFFERENTIAL/PLATELET
BASOS PCT: 1 %
Basophils Absolute: 0 10*3/uL (ref 0.0–0.1)
Eosinophils Absolute: 0.1 10*3/uL (ref 0.0–0.5)
Eosinophils Relative: 2 %
HEMATOCRIT: 41.6 % (ref 34.8–46.6)
HEMOGLOBIN: 13.8 g/dL (ref 11.6–15.9)
LYMPHS ABS: 1.4 10*3/uL (ref 0.9–3.3)
Lymphocytes Relative: 31 %
MCH: 31.6 pg (ref 25.1–34.0)
MCHC: 33.2 g/dL (ref 31.5–36.0)
MCV: 95.2 fL (ref 79.5–101.0)
MONO ABS: 0.3 10*3/uL (ref 0.1–0.9)
MONOS PCT: 7 %
NEUTROS ABS: 2.7 10*3/uL (ref 1.5–6.5)
NEUTROS PCT: 59 %
Platelets: 162 10*3/uL (ref 145–400)
RBC: 4.37 MIL/uL (ref 3.70–5.45)
RDW: 13.1 % (ref 11.2–14.5)
WBC: 4.5 10*3/uL (ref 3.9–10.3)

## 2017-04-26 NOTE — Telephone Encounter (Signed)
Scheduled appt per 2/1 los - Gave patient AVS and calender per los.  

## 2017-05-23 ENCOUNTER — Other Ambulatory Visit: Payer: Self-pay | Admitting: Hematology

## 2017-05-23 DIAGNOSIS — Z1231 Encounter for screening mammogram for malignant neoplasm of breast: Secondary | ICD-10-CM

## 2017-05-30 ENCOUNTER — Other Ambulatory Visit: Payer: Self-pay

## 2017-05-30 ENCOUNTER — Ambulatory Visit (INDEPENDENT_AMBULATORY_CARE_PROVIDER_SITE_OTHER): Payer: Medicare HMO | Admitting: Family Medicine

## 2017-05-30 ENCOUNTER — Encounter: Payer: Self-pay | Admitting: Family Medicine

## 2017-05-30 VITALS — BP 176/80 | HR 56 | Temp 97.6°F | Ht 60.24 in | Wt 127.0 lb

## 2017-05-30 DIAGNOSIS — Z794 Long term (current) use of insulin: Secondary | ICD-10-CM

## 2017-05-30 DIAGNOSIS — Z23 Encounter for immunization: Secondary | ICD-10-CM

## 2017-05-30 DIAGNOSIS — I10 Essential (primary) hypertension: Secondary | ICD-10-CM | POA: Diagnosis not present

## 2017-05-30 DIAGNOSIS — Z853 Personal history of malignant neoplasm of breast: Secondary | ICD-10-CM

## 2017-05-30 DIAGNOSIS — M810 Age-related osteoporosis without current pathological fracture: Secondary | ICD-10-CM

## 2017-05-30 DIAGNOSIS — E1165 Type 2 diabetes mellitus with hyperglycemia: Secondary | ICD-10-CM | POA: Diagnosis not present

## 2017-05-30 DIAGNOSIS — IMO0001 Reserved for inherently not codable concepts without codable children: Secondary | ICD-10-CM

## 2017-05-30 LAB — POCT GLYCOSYLATED HEMOGLOBIN (HGB A1C): Hemoglobin A1C: 8.5

## 2017-05-30 MED ORDER — AMLODIPINE BESYLATE 2.5 MG PO TABS: 2.5000 mg | ORAL_TABLET | Freq: Every day | ORAL | 1 refills | Status: DC

## 2017-05-30 NOTE — Progress Notes (Signed)
3/7/20198:37 AM  Erika Campos [birth year], 77 y.o. female [id number]  Chief Complaint  Patient presents with  . Follow-up    Diabetes follow up. Has appt in July for diabetic eye exam    HPI:   Patient is a 77 y.o. female with past medical history significant for DM2, HTN, HLP and breast cancer and osteoporosis who presents today for followup on her chronic medical conditions  1. HTN - taking meds as rx, denies side effects. Checks at home on occ, mostly around 150s, tends to be higher at the doctors office as she gets a bit nervous  2. DM - on insulin, last a1c 9.0 in dec 2018, reports cbgs this am of 117. Reports she takes metformin at bedtime and not with dinner. She does not do her own injections as she is afraid, her husband does, she reports checking cbgs mostly after breakfast and dinner, not before. She denies any ssx of hypoglycemia. She continues to struggle with diet. She sees eye doctor in July for yearly eye exam.   3. BrCa, left - saw med onc in [place] for yearly visit, normal mammogram, doing well, no recurrence, fu 1 year  4. Osteoporosis - taking ca/vit d and alendronate, mild gerd sx triggered by very specific foods. Otherwise doing well. Does not exercise much due to cold weather. Normally tries to walk daily.   5. Immunizations - had prevnar 13 in 07/2014, has not had pneumovax 23, did have this seasons flu vaccine  Depression screen [facility] 2/9 05/30/2017 02/28/2017 11/29/2016  Decreased Interest 0 0 0  Down, Depressed, Hopeless 0 0 0  PHQ - 2 Score 0 0 0    Allergies  Allergen Reactions  . Epinephrine Other (See Comments)    panic    Prior to Admission medications   Medication Sig Start Date End Date Taking? Authorizing Provider  alendronate (FOSAMAX) 70 MG tablet Take 1 tablet (70 mg total) by mouth every 7 (seven) days. Take with a full glass of water on an empty stomach. 02/28/17  Yes Rutherford Guys, MD  aspirin 81 MG tablet Take 81 mg by mouth every morning.     Yes [provider]  blood glucose meter kit and supplies KIT Dispense based on patient and insurance preference. Check blood glucose three times a day. Dx E11.9, z79.4 02/28/17  Yes Rutherford Guys, MD  Calcium Carb-Cholecalciferol (CALCIUM-VITAMIN D) 500-200 MG-UNIT tablet Take 1 tablet by mouth every morning. 02/28/17  Yes Rutherford Guys, MD  insulin aspart (NOVOLOG) 100 UNIT/ML FlexPen Inject 8 Units into the skin 3 (three) times daily with meals. 02/28/17  Yes Rutherford Guys, MD  Insulin Glargine (LANTUS SOLOSTAR) 100 UNIT/ML Solostar Pen Inject 22 Units into the skin daily at 10 pm. 02/28/17  Yes Rutherford Guys, MD  Insulin Pen Needle (PEN NEEDLES 5/16") 30G X 8 MM MISC 1 each by Does not apply route 4 (four) times daily -  before meals and at bedtime. 02/28/17  Yes Rutherford Guys, MD  losartan-hydrochlorothiazide (HYZAAR) 100-25 MG tablet Take 1 tablet by mouth daily. 02/28/17  Yes Rutherford Guys, MD  metFORMIN (GLUCOPHAGE) 1000 MG tablet Take 1 tablet (1,000 mg total) by mouth 2 (two) times daily with a meal. 02/28/17  Yes Rutherford Guys, MD  Multiple Vitamin (MULTIVITAMIN) capsule Take 1 capsule by mouth every morning.    Yes [provider]  simvastatin (ZOCOR) 20 MG tablet Take 1 tablet (20 mg total) by mouth  every evening. 02/28/17  Yes Rutherford Guys, MD    Past Medical History:  Diagnosis Date  . Cancer (HCC)    breast  . Diabetes mellitus   . Hyperlipidemia   . Hypertension   . Osteoporosis     Past Surgical History:  Procedure Laterality Date  . BREAST SURGERY Left   . CYSTECTOMY     lt breast  . FRACTURE SURGERY      Social History   Tobacco Use  . Smoking status: Never Smoker  . Smokeless tobacco: Never Used  Substance Use Topics  . Alcohol use: No    Family History  Problem Relation Age of Onset  . Breast cancer Neg Hx     Review of Systems  Constitutional: Negative for chills and fever.  Respiratory: Negative for cough and  shortness of breath.   Cardiovascular: Negative for chest pain, palpitations and leg swelling.  Gastrointestinal: Negative for abdominal pain, nausea and vomiting.     OBJECTIVE:  Blood pressure (!) 176/80, pulse (!) 56, temperature 97.6 F (36.4 C), temperature source Oral, height 5' 0.24" (1.53 m), weight 127 lb (57.6 kg), SpO2 99 %.  Recheck BP 163/72  Wt Readings from Last 3 Encounters:  05/30/17 127 lb (57.6 kg)  04/26/17 129 lb 14.4 oz (58.9 kg)  02/28/17 130 lb (59 kg)    Physical Exam  Constitutional: She is oriented to person, place, and time and well-developed, well-nourished, and in no distress.  HENT:  Head: Normocephalic and atraumatic.  Mouth/Throat: Oropharynx is clear and moist. No oropharyngeal exudate.  Eyes: EOM are normal. Pupils are equal, round, and reactive to light. No scleral icterus.  Neck: Neck supple.  Cardiovascular: Normal rate, regular rhythm and normal heart sounds. Exam reveals no gallop and no friction rub.  No murmur heard. Pulmonary/Chest: Effort normal and breath sounds normal. She has no wheezes. She has no rales.  Musculoskeletal: She exhibits no edema.  Neurological: She is alert and oriented to person, place, and time. Gait normal.  Skin: Skin is warm and dry.    Results for orders placed or performed in visit on 05/30/17 (from the past 24 hour(s))  POCT glycosylated hemoglobin (Hb A1C)     Status: Abnormal   Collection Time: 05/30/17  8:52 AM  Result Value Ref Range   Hemoglobin A1C 8.5       ASSESSMENT and PLAN  1. Uncontrolled type 2 diabetes mellitus without complication, with long-term current use of insulin (HCC) - POCT glycosylated hemoglobin (Hb A1C) Improved, given age goal < 8.0 is reasonable. Discussed importance of timing of medication and checking cbgs - ie taking before meals. Discussed diet changes, importance of portions. Eye exam as scheduled in July.   2. Essential hypertension, benign Above goal, adding low  dose amlodipine, new med r/se/b reviewed. cont checking bp at home. Goal < 140/90.   3. History of left breast cancer In remission, sees med-onc yearly for surveillance  4. Need for vaccination Pneumovax 23 given today  5. Osteoporosis without current pathological fracture, unspecified osteoporosis type Tolerating meds well, no significant gerd since started alendronate. dexa due dec 2019. Discussed importance of weight bearing exercise.  Other orders - amLODipine (NORVASC) 2.5 MG tablet; Take 1 tablet (2.5 mg total) by mouth daily. - Pneumococcal polysaccharide vaccine 23-valent greater than or equal to 2yo subcutaneous/IM  Return in about 4 weeks (around 06/27/2017). for HTN    Rutherford Guys, MD Primary Care at [place], [place]  [address] Ph.  [phone] Fax [phone]

## 2017-05-30 NOTE — Patient Instructions (Addendum)
1. chequearse la azucar antes de comer, no despues 2. tomarse la metformin antes/con el desayuno y la cena 3. La insulina LANTUS controla la auzcar en ayuna (la de la [place]), meta para esa azucar es entre 90-130. usar lantus antes de dormir    IF you received an x-ray today, you will receive an invoice from [facility]. Please contact [facility] at [phone] with questions or concerns regarding your invoice.   IF you received labwork today, you will receive an invoice from [facility]. Please contact [facility] at [phone] with questions or concerns regarding your invoice.   Our billing staff will not be able to assist you with questions regarding bills from these companies.  You will be contacted with the lab results as soon as they are available. The fastest way to get your results is to activate your My Chart account. Instructions are located on the last page of this paperwork. If you have not heard from [place] regarding the results in 2 weeks, please contact this office.

## 2017-05-31 ENCOUNTER — Encounter: Payer: Self-pay | Admitting: Family Medicine

## 2017-06-21 ENCOUNTER — Other Ambulatory Visit: Payer: Self-pay

## 2017-06-21 ENCOUNTER — Ambulatory Visit (INDEPENDENT_AMBULATORY_CARE_PROVIDER_SITE_OTHER): Payer: Medicare HMO | Admitting: Family Medicine

## 2017-06-21 ENCOUNTER — Encounter: Payer: Self-pay | Admitting: Family Medicine

## 2017-06-21 ENCOUNTER — Ambulatory Visit (INDEPENDENT_AMBULATORY_CARE_PROVIDER_SITE_OTHER): Payer: Medicare HMO

## 2017-06-21 DIAGNOSIS — I10 Essential (primary) hypertension: Secondary | ICD-10-CM | POA: Diagnosis not present

## 2017-06-21 DIAGNOSIS — R059 Cough, unspecified: Secondary | ICD-10-CM

## 2017-06-21 DIAGNOSIS — R05 Cough: Secondary | ICD-10-CM

## 2017-06-21 DIAGNOSIS — R6889 Other general symptoms and signs: Secondary | ICD-10-CM | POA: Diagnosis not present

## 2017-06-21 DIAGNOSIS — J069 Acute upper respiratory infection, unspecified: Secondary | ICD-10-CM

## 2017-06-21 LAB — POCT CBC
Granulocyte percent: 67.8 %G (ref 37–80)
HCT, POC: 41 % (ref 37.7–47.9)
Hemoglobin: 13.3 g/dL (ref 12.2–16.2)
Lymph, poc: 1.4 (ref 0.6–3.4)
MCH, POC: 30.7 pg (ref 27–31.2)
MCHC: 32.4 g/dL (ref 31.8–35.4)
MCV: 94.8 fL (ref 80–97)
MID (cbc): 0.3 (ref 0–0.9)
MPV: 7.6 fL (ref 0–99.8)
POC Granulocyte: 3.5 (ref 2–6.9)
POC LYMPH PERCENT: 26 %L (ref 10–50)
POC MID %: 6.2 %M (ref 0–12)
Platelet Count, POC: 188 10*3/uL (ref 142–424)
RBC: 4.33 M/uL (ref 4.04–5.48)
RDW, POC: 12.9 %
WBC: 5 10*3/uL (ref 4.6–10.2)

## 2017-06-21 LAB — POC INFLUENZA A&B (BINAX/QUICKVUE)
Influenza A, POC: NEGATIVE
Influenza B, POC: NEGATIVE

## 2017-06-21 MED ORDER — BENZONATATE 100 MG PO CAPS: 100.0000 mg | ORAL_CAPSULE | Freq: Three times a day (TID) | ORAL | 0 refills | Status: DC | PRN

## 2017-06-21 NOTE — Progress Notes (Signed)
3/29/20194:39 PM  Erika Campos [birth year], 77 y.o. female [id number]  Chief Complaint  Patient presents with  . Cough    Excessive coughing, vertigo, and day sleeping. Taking robitussin for diabetics to releive symptoms    HPI:   Patient is a 77 y.o. female with past medical history significant for DM2, HTN who presents today for 4-5 days of productive cough, subjective fever, malaise, fatigue. She has spent most of the time in bed sleeping, just does not feel well. Had burning sensation in throat but that has resolved. She denies any ear pain, sinus pain. She denies any nausea, vomiting, diarrhea. She has been taking OTC robitussin for Diabetics. States she had one low cbg at the beginning of illness but none since. This morning fasting cbg 154. She has had flu and pneumonia vaccines. She denies any sick contacts.   Patient is tolerating new BP medication, amlodipine, well. Has no side effects.   Depression screen [facility] 2/9 06/21/2017 05/30/2017 02/28/2017  Decreased Interest 0 0 0  Down, Depressed, Hopeless 0 0 0  PHQ - 2 Score 0 0 0    Allergies  Allergen Reactions  . Epinephrine Other (See Comments)    panic    Prior to Admission medications   Medication Sig Start Date End Date Taking? Authorizing Provider  alendronate (FOSAMAX) 70 MG tablet Take 1 tablet (70 mg total) by mouth every 7 (seven) days. Take with a full glass of water on an empty stomach. 02/28/17  Yes Rutherford Guys, MD  amLODipine (NORVASC) 2.5 MG tablet Take 1 tablet (2.5 mg total) by mouth daily. 05/30/17  Yes Rutherford Guys, MD  aspirin 81 MG tablet Take 81 mg by mouth every morning.    Yes [provider]  blood glucose meter kit and supplies KIT Dispense based on patient and insurance preference. Check blood glucose three times a day. Dx E11.9, z79.4 02/28/17  Yes Rutherford Guys, MD  Calcium Carb-Cholecalciferol (CALCIUM-VITAMIN D) 500-200 MG-UNIT tablet Take 1 tablet by mouth every morning. 02/28/17   Yes Rutherford Guys, MD  insulin aspart (NOVOLOG) 100 UNIT/ML FlexPen Inject 8 Units into the skin 3 (three) times daily with meals. 02/28/17  Yes Rutherford Guys, MD  Insulin Glargine (LANTUS SOLOSTAR) 100 UNIT/ML Solostar Pen Inject 22 Units into the skin daily at 10 pm. 02/28/17  Yes Rutherford Guys, MD  Insulin Pen Needle (PEN NEEDLES 5/16") 30G X 8 MM MISC 1 each by Does not apply route 4 (four) times daily -  before meals and at bedtime. 02/28/17  Yes Rutherford Guys, MD  losartan-hydrochlorothiazide (HYZAAR) 100-25 MG tablet Take 1 tablet by mouth daily. 02/28/17  Yes Rutherford Guys, MD  metFORMIN (GLUCOPHAGE) 1000 MG tablet Take 1 tablet (1,000 mg total) by mouth 2 (two) times daily with a meal. 02/28/17  Yes Rutherford Guys, MD  Multiple Vitamin (MULTIVITAMIN) capsule Take 1 capsule by mouth every morning.    Yes [provider]  simvastatin (ZOCOR) 20 MG tablet Take 1 tablet (20 mg total) by mouth every evening. 02/28/17  Yes Rutherford Guys, MD    Past Medical History:  Diagnosis Date  . Cancer (HCC)    breast  . Diabetes mellitus   . Hyperlipidemia   . Hypertension   . Osteoporosis     Past Surgical History:  Procedure Laterality Date  . BREAST SURGERY Left   . CYSTECTOMY     lt breast  . FRACTURE SURGERY  Social History   Tobacco Use  . Smoking status: Never Smoker  . Smokeless tobacco: Never Used  Substance Use Topics  . Alcohol use: No    Family History  Problem Relation Age of Onset  . Breast cancer Neg Hx     ROS Per hpi  OBJECTIVE:  Blood pressure (!) 142/84, pulse 71, temperature 98.7 F (37.1 C), temperature source Oral, height 5' 0.5" (1.537 m), weight 126 lb (57.2 kg), SpO2 96 %.  Physical Exam  Constitutional: She is oriented to person, place, and time and well-developed, well-nourished, and in no distress. She has a sickly appearance.  HENT:  Head: Normocephalic and atraumatic.  Right Ear: Hearing, tympanic membrane,  external ear and ear canal normal.  Left Ear: Hearing, tympanic membrane, external ear and ear canal normal.  Mouth/Throat: Oropharynx is clear and moist.  Eyes: Pupils are equal, round, and reactive to light. EOM are normal.  Neck: Neck supple.  Cardiovascular: Normal rate, regular rhythm and normal heart sounds. Exam reveals no gallop and no friction rub.  No murmur heard. Pulmonary/Chest: Effort normal and breath sounds normal. She has no wheezes. She has no rales.  Lymphadenopathy:    She has no cervical adenopathy.  Neurological: She is alert and oriented to person, place, and time. Gait normal.  Skin: Skin is warm and dry.     Results for orders placed or performed in visit on 06/21/17 (from the past 24 hour(s))  POC Influenza A&B(BINAX/QUICKVUE)     Status: Normal   Collection Time: 06/21/17  4:54 PM  Result Value Ref Range   Influenza A, POC Negative Negative   Influenza B, POC Negative Negative  POCT CBC     Status: Normal   Collection Time: 06/21/17  5:50 PM  Result Value Ref Range   WBC 5.0 4.6 - 10.2 K/uL   Lymph, poc 1.4 0.6 - 3.4   POC LYMPH PERCENT 26.0 10 - 50 %L   MID (cbc) 0.3 0 - 0.9   POC MID % 6.2 0 - 12 %M   POC Granulocyte 3.5 2 - 6.9   Granulocyte percent 67.8 37 - 80 %G   RBC 4.33 4.04 - 5.48 M/uL   Hemoglobin 13.3 12.2 - 16.2 g/dL   HCT, POC 41.0 37.7 - 47.9 %   MCV 94.8 80 - 97 fL   MCH, POC 30.7 27 - 31.2 pg   MCHC 32.4 31.8 - 35.4 g/dL   RDW, POC 12.9 %   Platelet Count, POC 188 142 - 424 K/uL   MPV 7.6 0 - 99.8 fL    Dg Chest 2 View  Result Date: 06/21/2017 CLINICAL DATA:  Productive cough with malaise EXAM: CHEST - 2 VIEW COMPARISON:  06/21/2011 FINDINGS: No acute infiltrate or effusion. Cardiomediastinal silhouette upper normal. Surgical clips in the left axilla. No pneumothorax. Degenerative changes of the spine. Aortic atherosclerosis IMPRESSION: No active cardiopulmonary disease. Electronically Signed   By: Donavan Foil M.D.   On:  06/21/2017 17:40     ASSESSMENT and PLAN  1. URI with cough and congestion Discussed supportive measures for URI: increase hydration, rest, OTC medications, etc. RTC precautions discussed.  2. Essential hypertension, benign Much improved, cont with current medications.   3. Flu-like symptoms - POC Influenza A&B(BINAX/QUICKVUE)  4. Cough - POCT CBC - DG Chest 2 View; Future  Other orders - benzonatate (TESSALON) 100 MG capsule; Take 1-2 capsules (100-200 mg total) by mouth 3 (three) times daily as needed for cough.  Return in about 3 months (around 09/21/2017) for DM/HTN.    Rutherford Guys, MD Primary Care at [place], [place] [address] Ph.  [phone] Fax [phone]

## 2017-06-21 NOTE — Patient Instructions (Signed)
     IF you received an x-ray today, you will receive an invoice from [facility]. Please contact [facility] at [phone] with questions or concerns regarding your invoice.   IF you received labwork today, you will receive an invoice from [facility]. Please contact [facility] at [phone] with questions or concerns regarding your invoice.   Our billing staff will not be able to assist you with questions regarding bills from these companies.  You will be contacted with the lab results as soon as they are available. The fastest way to get your results is to activate your My Chart account. Instructions are located on the last page of this paperwork. If you have not heard from us regarding the results in 2 weeks, please contact this office.     

## 2017-06-27 ENCOUNTER — Ambulatory Visit: Payer: Medicare HMO | Admitting: Family Medicine

## 2017-07-03 ENCOUNTER — Telehealth: Payer: Self-pay | Admitting: Family Medicine

## 2017-07-03 NOTE — Telephone Encounter (Signed)
Called pt to see if I could reschedule her for her missed appt on 06/27/17. She said that she came in one week early because she was sick. SOmone was supposed to cancel the appt. F/U is made for June.

## 2017-07-19 ENCOUNTER — Telehealth: Payer: Self-pay | Admitting: Family Medicine

## 2017-07-19 NOTE — Telephone Encounter (Signed)
Alyse Low, RN from [facility] called and asked for the dosage and administration of the patient's insulin. I advised according to the chart, Lantus 22 units QHS and Novolog 8 units TID with meals. She says "the patient says she takes Lantus 20 units every day and Novolog 8 units once a day. She says if she eats something that will run her sugar up, she will take additional Novolog. She says the patient is not checking her blood sugars before meals, but after she's eaten." Patty is asking for someone from the office to call an inform the patient the correct dosages and how she needs to take her insulin. She also asks that her home health nurse be notified of the same, so that she will know what to follow up on with the patient at the next visit. The [facility] is [place], [phone] ext [phone]. I advised this will be sent to the provider for review.

## 2017-07-26 NOTE — Telephone Encounter (Signed)
Noted. This is an ongoing issue. I have had this conversation multiple times. I will readdress at our next visit.

## 2017-09-05 ENCOUNTER — Encounter: Payer: Self-pay | Admitting: Family Medicine

## 2017-09-05 ENCOUNTER — Other Ambulatory Visit: Payer: Self-pay

## 2017-09-05 ENCOUNTER — Ambulatory Visit (INDEPENDENT_AMBULATORY_CARE_PROVIDER_SITE_OTHER): Payer: Medicare HMO | Admitting: Family Medicine

## 2017-09-05 VITALS — BP 158/76 | HR 54 | Temp 97.4°F | Ht 61.42 in | Wt 126.2 lb

## 2017-09-05 DIAGNOSIS — I10 Essential (primary) hypertension: Secondary | ICD-10-CM

## 2017-09-05 DIAGNOSIS — M810 Age-related osteoporosis without current pathological fracture: Secondary | ICD-10-CM

## 2017-09-05 DIAGNOSIS — E1165 Type 2 diabetes mellitus with hyperglycemia: Secondary | ICD-10-CM

## 2017-09-05 DIAGNOSIS — IMO0001 Reserved for inherently not codable concepts without codable children: Secondary | ICD-10-CM

## 2017-09-05 DIAGNOSIS — Z794 Long term (current) use of insulin: Secondary | ICD-10-CM | POA: Diagnosis not present

## 2017-09-05 LAB — POCT GLYCOSYLATED HEMOGLOBIN (HGB A1C): Hemoglobin A1C: 7.5 % — AB (ref 4.0–5.6)

## 2017-09-05 MED ORDER — INSULIN ASPART 100 UNIT/ML FLEXPEN: 8.0000 [IU] | PEN_INJECTOR | Freq: Three times a day (TID) | SUBCUTANEOUS | 5 refills | Status: DC

## 2017-09-05 MED ORDER — ALENDRONATE SODIUM 70 MG PO TABS: 70.0000 mg | ORAL_TABLET | ORAL | 1 refills | Status: DC

## 2017-09-05 MED ORDER — AMLODIPINE BESYLATE 5 MG PO TABS: 5.0000 mg | ORAL_TABLET | Freq: Every day | ORAL | 1 refills | Status: DC

## 2017-09-05 MED ORDER — SIMVASTATIN 20 MG PO TABS
20.0000 mg | ORAL_TABLET | Freq: Every evening | ORAL | 1 refills | Status: AC
Start: 2017-09-05 — End: ?

## 2017-09-05 MED ORDER — METFORMIN HCL 1000 MG PO TABS: 1000.0000 mg | ORAL_TABLET | Freq: Two times a day (BID) | ORAL | 1 refills | Status: AC

## 2017-09-05 MED ORDER — LOSARTAN POTASSIUM-HCTZ 100-25 MG PO TABS: 1.0000 | ORAL_TABLET | Freq: Every day | ORAL | 1 refills | Status: DC

## 2017-09-05 MED ORDER — INSULIN GLARGINE 100 UNIT/ML SOLOSTAR PEN: 22.0000 [IU] | PEN_INJECTOR | Freq: Every day | SUBCUTANEOUS | 5 refills | Status: DC

## 2017-09-05 MED ORDER — "PEN NEEDLES 5/16"" 30G X 8 MM MISC": 1.0000 | Freq: Three times a day (TID) | 1 refills | Status: DC

## 2017-09-05 NOTE — Progress Notes (Signed)
6/13/20199:01 AM  Erika Campos [birth year], 77 y.o. female [id number]  Chief Complaint  Patient presents with  . Follow-up    Diabetes management    HPI:   Patient is a 77 y.o. female with past medical history significant for DM2, HTN, HLP and breast cancer and osteoporosis who presents today for followup on her chronic medical conditions  1. HTN - taking meds as rx, denies side effects. Checks at home on occ, mostly around 140s, tends to be higher at the doctors office as she gets a bit nervous  2. DM - on insulin, last a1c 8.5 in mar 2019, reports cbgs yesterday am 128. She has made significant progress in her diet, still takes medications in a non-conventional manner. Had one episode of hypoglycemia, + sx, due to insulin-meal mismatch. She sees eye doctor in July for yearly eye exam.   She will be moving an hour away. She has no acute concerns today  Fall Risk  09/05/2017 06/21/2017 05/30/2017 02/28/2017 11/29/2016  Falls in the past year? _0      Depression screen [facility] 2/9 09/05/2017 06/21/2017 05/30/2017  Decreased Interest 0 0 0  Down, Depressed, Hopeless - 0 0  PHQ - 2 Score 0 0 0    Allergies  Allergen Reactions  . Epinephrine Other (See Comments)    panic    Prior to Admission medications   Medication Sig Start Date End Date Taking? Authorizing Provider  alendronate (FOSAMAX) 70 MG tablet Take 1 tablet (70 mg total) by mouth every 7 (seven) days. Take with a full glass of water on an empty stomach. 02/28/17  Yes Rutherford Guys, MD  amLODipine (NORVASC) 2.5 MG tablet Take 1 tablet (2.5 mg total) by mouth daily. 05/30/17  Yes Rutherford Guys, MD  aspirin 81 MG tablet Take 81 mg by mouth every morning.    Yes [provider]  benzonatate (TESSALON) 100 MG capsule Take 1-2 capsules (100-200 mg total) by mouth 3 (three) times daily as needed for cough. 06/21/17  Yes Rutherford Guys, MD  blood glucose meter kit and supplies KIT Dispense based on patient and  insurance preference. Check blood glucose three times a day. Dx E11.9, z79.4 02/28/17  Yes Rutherford Guys, MD  Calcium Carb-Cholecalciferol (CALCIUM-VITAMIN D) 500-200 MG-UNIT tablet Take 1 tablet by mouth every morning. 02/28/17  Yes Rutherford Guys, MD  insulin aspart (NOVOLOG) 100 UNIT/ML FlexPen Inject 8 Units into the skin 3 (three) times daily with meals. 02/28/17  Yes Rutherford Guys, MD  Insulin Glargine (LANTUS SOLOSTAR) 100 UNIT/ML Solostar Pen Inject 22 Units into the skin daily at 10 pm. 02/28/17  Yes Rutherford Guys, MD  Insulin Pen Needle (PEN NEEDLES 5/16") 30G X 8 MM MISC 1 each by Does not apply route 4 (four) times daily -  before meals and at bedtime. 02/28/17  Yes Rutherford Guys, MD  losartan-hydrochlorothiazide (HYZAAR) 100-25 MG tablet Take 1 tablet by mouth daily. 02/28/17  Yes Rutherford Guys, MD  metFORMIN (GLUCOPHAGE) 1000 MG tablet Take 1 tablet (1,000 mg total) by mouth 2 (two) times daily with a meal. 02/28/17  Yes Rutherford Guys, MD  Multiple Vitamin (MULTIVITAMIN) capsule Take 1 capsule by mouth every morning.    Yes [provider]  simvastatin (ZOCOR) 20 MG tablet Take 1 tablet (20 mg total) by mouth every evening. 02/28/17  Yes Rutherford Guys, MD    Past Medical History:  Diagnosis Date  .  Cancer (HCC)    breast  . Diabetes mellitus   . Hyperlipidemia   . Hypertension   . Osteoporosis     Past Surgical History:  Procedure Laterality Date  . BREAST SURGERY Left   . CYSTECTOMY     lt breast  . FRACTURE SURGERY      Social History   Tobacco Use  . Smoking status: Never Smoker  . Smokeless tobacco: Never Used  Substance Use Topics  . Alcohol use: No    Family History  Problem Relation Age of Onset  . Breast cancer Neg Hx     Review of Systems  Constitutional: Negative for chills and fever.  Respiratory: Negative for cough and shortness of breath.   Cardiovascular: Negative for chest pain, palpitations and leg swelling.    Gastrointestinal: Negative for abdominal pain, nausea and vomiting.     OBJECTIVE:  Blood pressure (!) 158/76, pulse (!) 54, temperature (!) 97.4 F (36.3 C), temperature source Oral, height 5' 1.42" (1.56 m), weight 126 lb 3.2 oz (57.2 kg), SpO2 99 %.  Wt Readings from Last 3 Encounters:  09/05/17 126 lb 3.2 oz (57.2 kg)  06/21/17 126 lb (57.2 kg)  05/30/17 127 lb (57.6 kg)    Physical Exam  Constitutional: She is oriented to person, place, and time. She appears well-developed and well-nourished.  HENT:  Head: Normocephalic and atraumatic.  Mouth/Throat: Oropharynx is clear and moist. No oropharyngeal exudate.  Eyes: Pupils are equal, round, and reactive to light. EOM are normal. No scleral icterus.  Neck: Neck supple.  Cardiovascular: Normal rate, regular rhythm and normal heart sounds. Exam reveals no gallop and no friction rub.  No murmur heard. Pulmonary/Chest: Effort normal and breath sounds normal. She has no wheezes. She has no rales.  Musculoskeletal: She exhibits no edema.  Neurological: She is alert and oriented to person, place, and time.  Skin: Skin is warm and dry.  Psychiatric: She has a normal mood and affect.  Nursing note and vitals reviewed.   Results for orders placed or performed in visit on 09/05/17 (from the past 24 hour(s))  POCT glycosylated hemoglobin (Hb A1C)     Status: Abnormal   Collection Time: 09/05/17  9:13 AM  Result Value Ref Range   Hemoglobin A1C 7.5 (A) 4.0 - 5.6 %   HbA1c, POC (prediabetic range)  5.7 - 6.4 %   HbA1c, POC (controlled diabetic range)  0.0 - 7.0 %     ASSESSMENT and PLAN 1. Uncontrolled type 2 diabetes mellitus without complication, with long-term current use of insulin (HCC) Improving, continue current regime. a1c of 7.5 is acceptable for age. Eye exam in July.  - POCT glycosylated hemoglobin (Hb A1C)  2. Essential hypertension, benign Better but still above goal. Increasing amlodipine to 45m daily. Cont to  monitor BP at home.  3. Osteoporosis without current pathological fracture, unspecified osteoporosis type - alendronate (FOSAMAX) 70 MG tablet; Take 1 tablet (70 mg total) by mouth every 7 (seven) days. Take with a full glass of water on an empty stomach.  meds refilled for 90 days with 1 refill, they are due for next appt in 3 months either with me or new PCP as they are moving an hour away  Other orders - amLODipine (NORVASC) 5 MG tablet; Take 1 tablet (5 mg total) by mouth daily. - insulin aspart (NOVOLOG) 100 UNIT/ML FlexPen; Inject 8 Units into the skin 3 (three) times daily with meals. - Insulin Glargine (LANTUS SOLOSTAR) 100 UNIT/ML Solostar  Pen; Inject 22 Units into the skin daily at 10 pm. - Insulin Pen Needle (PEN NEEDLES 5/16") 30G X 8 MM MISC; 1 each by Does not apply route 4 (four) times daily -  before meals and at bedtime. - losartan-hydrochlorothiazide (HYZAAR) 100-25 MG tablet; Take 1 tablet by mouth daily. - metFORMIN (GLUCOPHAGE) 1000 MG tablet; Take 1 tablet (1,000 mg total) by mouth 2 (two) times daily with a meal. - simvastatin (ZOCOR) 20 MG tablet; Take 1 tablet (20 mg total) by mouth every evening.  Return in about 3 months (around 12/06/2017).    Rutherford Guys, MD Primary Care at [place], [place] [address] Ph.  [phone] Fax [phone]

## 2017-09-05 NOTE — Patient Instructions (Addendum)
Hydrocortisone 1%    IF you received an x-ray today, you will receive an invoice from [facility]. Please contact [facility] at [phone] with questions or concerns regarding your invoice.   IF you received labwork today, you will receive an invoice from [place]. Please contact [facility] at [phone] with questions or concerns regarding your invoice.   Our billing staff will not be able to assist you with questions regarding bills from these companies.  You will be contacted with the lab results as soon as they are available. The fastest way to get your results is to activate your My Chart account. Instructions are located on the last page of this paperwork. If you have not heard from [place] regarding the results in 2 weeks, please contact this office.

## 2017-09-27 ENCOUNTER — Ambulatory Visit: Payer: Medicare HMO | Admitting: Family Medicine

## 2017-10-24 ENCOUNTER — Encounter: Payer: Self-pay | Admitting: *Deleted

## 2017-10-24 LAB — HM DIABETES EYE EXAM

## 2017-11-19 ENCOUNTER — Encounter: Payer: Self-pay | Admitting: Family Medicine

## 2017-11-19 DIAGNOSIS — E11319 Type 2 diabetes mellitus with unspecified diabetic retinopathy without macular edema: Secondary | ICD-10-CM | POA: Insufficient documentation

## 2017-12-10 ENCOUNTER — Ambulatory Visit (INDEPENDENT_AMBULATORY_CARE_PROVIDER_SITE_OTHER): Payer: Medicare HMO | Admitting: Family Medicine

## 2017-12-10 ENCOUNTER — Encounter: Payer: Self-pay | Admitting: Family Medicine

## 2017-12-10 VITALS — BP 154/66 | HR 82 | Temp 97.5°F | Ht 61.42 in | Wt 127.2 lb

## 2017-12-10 DIAGNOSIS — E10319 Type 1 diabetes mellitus with unspecified diabetic retinopathy without macular edema: Secondary | ICD-10-CM | POA: Diagnosis not present

## 2017-12-10 DIAGNOSIS — Z794 Long term (current) use of insulin: Secondary | ICD-10-CM | POA: Diagnosis not present

## 2017-12-10 DIAGNOSIS — I10 Essential (primary) hypertension: Secondary | ICD-10-CM

## 2017-12-10 DIAGNOSIS — E785 Hyperlipidemia, unspecified: Secondary | ICD-10-CM | POA: Diagnosis not present

## 2017-12-10 LAB — POCT GLYCOSYLATED HEMOGLOBIN (HGB A1C): Hemoglobin A1C: 7.1 % — AB (ref 4.0–5.6)

## 2017-12-10 MED ORDER — LOSARTAN POTASSIUM 100 MG PO TABS: 100.0000 mg | ORAL_TABLET | Freq: Every day | ORAL | 3 refills | Status: AC

## 2017-12-10 MED ORDER — CHLORTHALIDONE 25 MG PO TABS: 25.0000 mg | ORAL_TABLET | Freq: Every day | ORAL | 1 refills | Status: AC

## 2017-12-10 MED ORDER — AMLODIPINE BESYLATE 10 MG PO TABS: 10.0000 mg | ORAL_TABLET | Freq: Every day | ORAL | 1 refills | Status: AC

## 2017-12-10 NOTE — Progress Notes (Signed)
9/17/20199:29 AM  Erika Campos 1940/11/18, 77 y.o. female [id number]  Chief Complaint  Patient presents with  . Diabetes    uncontrolled type 2 dm mangement. Needing refill on Norvasc. Pt sttes she is now taking '10mg'$  instead of the '5mg'$     HPI:   Patient is a 77 y.o. female with past medical history significant for DM2, HTN, HLP, osteoporosis, remote breast cancer who presents today for chronic medical conditions  Lab Results  Component Value Date   HGBA1C 7.5 (A) 09/05/2017  Urine microalb/crt: due today, last one neg ACE: on arb Statin: LDL 93 in aug 2018 Pneumonia vaccine:  Eye exam: + retinopathy, last eye exam about 3 weeks ago with glaucoma specialist, gen ophth Dr Katy Fitch Foot exam: done today Complications: retinopathy Checking glucose at home daily Had a one time low 50s after novolog without meal  Checks BP at home, has been around 140s, has been having intermittent leg swelling Saw onc given h/o breast cancer. Surveillance only at this point, will see onc yearly  Recently moved, in process of unpacking, not sleeping well due to stress  Fall Risk  12/10/2017 09/05/2017 06/21/2017 05/30/2017 02/28/2017  Falls in the past year? No No No No No     Depression screen [facility] 2/9 12/10/2017 09/05/2017 06/21/2017  Decreased Interest 0 0 0  Down, Depressed, Hopeless 0 - 0  PHQ - 2 Score 0 0 0    Allergies  Allergen Reactions  . Epinephrine Other (See Comments)    panic    Prior to Admission medications   Medication Sig Start Date End Date Taking? Authorizing Provider  alendronate (FOSAMAX) 70 MG tablet Take 1 tablet (70 mg total) by mouth every 7 (seven) days. Take with a full glass of water on an empty stomach. 09/05/17  Yes Rutherford Guys, MD  amLODipine (NORVASC) 10 MG tablet Take 10 mg by mouth daily.   Yes [provider]  aspirin 81 MG tablet Take 81 mg by mouth every morning.    Yes [provider]  blood glucose meter kit and supplies KIT Dispense  based on patient and insurance preference. Check blood glucose three times a day. Dx E11.9, z79.4 02/28/17  Yes Rutherford Guys, MD  Calcium Carb-Cholecalciferol (CALCIUM-VITAMIN D) 500-200 MG-UNIT tablet Take 1 tablet by mouth every morning. 02/28/17  Yes Rutherford Guys, MD  insulin aspart (NOVOLOG) 100 UNIT/ML FlexPen Inject 8 Units into the skin 3 (three) times daily with meals. 09/05/17  Yes Rutherford Guys, MD  Insulin Glargine (LANTUS SOLOSTAR) 100 UNIT/ML Solostar Pen Inject 22 Units into the skin daily at 10 pm. 09/05/17  Yes Rutherford Guys, MD  Insulin Pen Needle (PEN NEEDLES 5/16") 30G X 8 MM MISC 1 each by Does not apply route 4 (four) times daily -  before meals and at bedtime. 09/05/17  Yes Rutherford Guys, MD  losartan-hydrochlorothiazide (HYZAAR) 100-25 MG tablet Take 1 tablet by mouth daily. 09/05/17  Yes Rutherford Guys, MD  metFORMIN (GLUCOPHAGE) 1000 MG tablet Take 1 tablet (1,000 mg total) by mouth 2 (two) times daily with a meal. 09/05/17  Yes Rutherford Guys, MD  Multiple Vitamin (MULTIVITAMIN) capsule Take 1 capsule by mouth every morning.    Yes [provider]  simvastatin (ZOCOR) 20 MG tablet Take 1 tablet (20 mg total) by mouth every evening. 09/05/17  Yes Rutherford Guys, MD    Past Medical History:  Diagnosis Date  . Cancer ([place])  breast  . Diabetes mellitus   . Hyperlipidemia   . Hypertension   . Osteoporosis   . Retinopathy due to secondary diabetes mellitus ([place]) 10/24/2016   Dr Katy Fitch    Past Surgical History:  Procedure Laterality Date  . BREAST SURGERY Left   . CYSTECTOMY     lt breast  . FRACTURE SURGERY      Social History   Tobacco Use  . Smoking status: Never Smoker  . Smokeless tobacco: Never Used  Substance Use Topics  . Alcohol use: No    Family History  Problem Relation Age of Onset  . Breast cancer Neg Hx     Review of Systems  Constitutional: Negative for chills and fever.  Respiratory: Negative for cough and  shortness of breath.   Cardiovascular: Positive for leg swelling. Negative for chest pain and palpitations.  Gastrointestinal: Negative for abdominal pain, nausea and vomiting.     OBJECTIVE:  Blood pressure (!) 154/66, pulse 82, temperature (!) 97.5 F (36.4 C), temperature source Oral, height 5' 1.42" (1.56 m), weight 127 lb 3.2 oz (57.7 kg), SpO2 98 %.. Body mass index is 23.71 kg/m.   BP Readings from Last 3 Encounters:  12/10/17 (!) 154/66  09/05/17 (!) 158/76  06/21/17 (!) 142/84   Wt Readings from Last 3 Encounters:  12/10/17 127 lb 3.2 oz (57.7 kg)  09/05/17 126 lb 3.2 oz (57.2 kg)  06/21/17 126 lb (57.2 kg)     Physical Exam  Constitutional: She is oriented to person, place, and time. She appears well-developed and well-nourished.  HENT:  Head: Normocephalic and atraumatic.  Mouth/Throat: Oropharynx is clear and moist. No oropharyngeal exudate.  Eyes: Pupils are equal, round, and reactive to light. Conjunctivae and EOM are normal. No scleral icterus.  Neck: Neck supple.  Cardiovascular: Normal rate, regular rhythm and normal heart sounds. Exam reveals no gallop and no friction rub.  No murmur heard. Pulmonary/Chest: Effort normal and breath sounds normal. She has no wheezes. She has no rales.  Musculoskeletal: She exhibits edema (trace pitting).  Neurological: She is alert and oriented to person, place, and time.  Skin: Skin is warm and dry.  Psychiatric: She has a normal mood and affect.  Nursing note and vitals reviewed.    Diabetic Foot Exam - Simple   Simple Foot Form Visual Inspection No deformities, no ulcerations, no other skin breakdown bilaterally:  Yes Sensation Testing Intact to touch and monofilament testing bilaterally:  Yes Pulse Check Comments Pt felt all pricks with the filament on both feet     Results for orders placed or performed in visit on 12/10/17 (from the past 24 hour(s))  POCT glycosylated hemoglobin (Hb A1C)     Status:  Abnormal   Collection Time: 12/10/17 10:13 AM  Result Value Ref Range   Hemoglobin A1C 7.1 (A) 4.0 - 5.6 %   HbA1c POC (<> result, manual entry)     HbA1c, POC (prediabetic range)     HbA1c, POC (controlled diabetic range)        ASSESSMENT and PLAN  1. 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 Encounter for long-term (current) use of insulin ([place]) Controlled. Continue current regime.  - TSH - Comprehensive metabolic panel - POCT glycosylated hemoglobin (Hb A1C) - Microalbumin/Creatinine Ratio, Urine   3. Essential hypertension, benign Uncontrolled. Changing hctz for chlorthalidone - TSH - Comprehensive metabolic panel - CBC - Care order/instruction:  4. Hyperlipidemia, unspecified  hyperlipidemia type Checking labs today, medications will be adjusted as needed.  - TSH - Comprehensive metabolic panel - Lipid panel  Other orders - amLODipine (NORVASC) 10 MG tablet; Take 1 tablet (10 mg total) by mouth daily. - losartan (COZAAR) 100 MG tablet; Take 1 tablet (100 mg total) by mouth daily. - chlorthalidone (HYGROTON) 25 MG tablet; Take 1 tablet (25 mg total) by mouth daily.  Return in about 3 months (around 03/11/2018).    Rutherford Guys, MD [facility] at [address], [place] [address] Ph.  [phone] Fax [phone]

## 2017-12-10 NOTE — Patient Instructions (Addendum)
dejar de tomar losartan-hctz, empezar losartan 100mg  por la manana, chlorthalidone 25mg  por la manana, amlodipine 10mg  at bedtime  regresar despues de septiembre 26 para la vacuna del high dose flu  If you have lab work done today you will be contacted with your lab results within the next 2 weeks.  If you have not heard from [place] then please contact us. The fastest way to get your results is to register for My Chart.   IF you received an x-ray today, you will receive an invoice from [facility]. Please contact [facility] at [phone] with questions or concerns regarding your invoice.   IF you received labwork today, you will receive an invoice from [place]. Please contact [facility] at [phone] with questions or concerns regarding your invoice.   Our billing staff will not be able to assist you with questions regarding bills from these companies.  You will be contacted with the lab results as soon as they are available. The fastest way to get your results is to activate your My Chart account. Instructions are located on the last page of this paperwork. If you have not heard from [place] regarding the results in 2 weeks, please contact this office.     Cmo controlar su hipertensin Managing Your Hypertension La hipertensin se denomina usualmente presin arterial alta. Ocurre cuando la sangre presiona contra las paredes de las arterias con demasiada fuerza. Las arterias son los vasos sanguneos que transportan la sangre desde el corazn hacia todas las partes del cuerpo. La hipertensin hace que el corazn haga ms esfuerzo para Chiropodist y [employer] que las arterias se Teacher, music o Advertising account executive. La hipertensin no tratada o no controlada puede causar infarto de miocardio, accidente cerebrovascular, enfermedad renal y otros problemas. Qu son las Futures trader de presin arterial? Una lectura de la presin arterial consiste de un nmero ms alto sobre un nmero ms bajo. En  condiciones ideales, la presin arterial debe estar por debajo de 120/80. El primer nmero ("superior") es la presin sistlica. Es la medida de la presin de las arterias cuando el corazn late. El segundo nmero ("inferior") es la presin diastlica. Es la medida de la presin en las arterias cuando el corazn se relaja. Qu significa mi lectura de presin arterial? La presin arterial se clasifica en cuatro etapas. Sobre la base de la lectura de su presin arterial, el mdico puede usar las siguientes etapas para determinar si necesita tratamiento y de qu tipo. La presin sistlica y la presin diastlica se miden en una unidad llamada mm Hg. Normal  Presin sistlica: por debajo de 270.  Presin diastlica: por debajo de 80. Elevada  Presin sistlica: 623-762.  Presin diastlica: por debajo de 80. Etapa 1 de hipertensin  Presin sistlica: 831-517.  Presin diastlica: 61-60. Etapa 2 de hipertensin  Presin sistlica: 737 o ms.  Presin diastlica: 90 o ms. Cules son los riesgos para la salud asociados con la hipertensin? Controlar la hipertensin es una responsabilidad importante. La hipertensin no controlada puede causar:  Infarto de miocardio.  Accidente cerebrovascular.  Debilitamiento de los vasos sanguneos (aneurisma).  Insuficiencia cardaca.  Dao renal.  Dao ocular.  Sndrome metablico.  Problemas de memoria y concentracin.  Qu cambios puedo hacer para controlar mi hipertensin? La hipertensin se puede controlar haciendo [employer] Corporation estilo de vida y, posiblemente, tomando medicamentos. Su mdico le ayudar a crear un plan para bajar la presin arterial al rango normal. Comida y bebida  Siga una dieta con alto contenido de  fibras y [place], y con bajo contenido de sal (sodio), azcar agregada y Daphene Jaeger. Un ejemplo de plan alimenticio es la dieta DASH (Dietary Approaches to Stop Hypertension, Mtodos alimenticios para detener la  hipertensin). Para alimentarse de esta manera: ? Coma mucha fruta y [place]. Trate de que la mitad del plato de cada comida sea de frutas y verduras. ? Coma cereales integrales, como pasta integral, arroz integral y pan integral. Llene aproximadamente un cuarto del plato con cereales integrales. ? Consuma productos lcteos con bajo contenido de grasa. ? Evite la ingesta de cortes de carne grasa, carne procesada o curada, y carne de ave con piel. Llene aproximadamente un cuarto del plato con protenas magras, como pescado, pollo sin piel, frijoles, huevos y tofu. ? Evite ingerir alimentos prehechos o procesados. En general, estos tienen mayor cantidad de sodio, azcar agregada y Wendee Copp.  Reduzca su ingesta diaria de sodio. La mayora de las personas que tienen hipertensin deben comer menos de 1500 mg de sodio por [employer].  Limite el consumo de alcohol a no ms de 1 medida por da si es mujer y no est Music therapist y a 2 medidas por da si es hombre. Una medida equivale a 12onzas de cerveza, 5onzas de vino o 1onzas de bebidas alcohlicas de alta graduacin. Estilo de vida  Trabaje con su mdico para mantener un peso saludable o Administrator, Civil Service. Pregntele cual es su peso recomendado.  Realice al menos 30 minutos de ejercicio que haga que se acelere su corazn (ejercicio Arboriculturist) la [employer] de la [place]. Estas actividades pueden incluir caminar, nadar o andar en bicicleta.  Incluya ejercicios para fortalecer sus msculos (ejercicios de resistencia), como levantamiento de pesas, como parte de su rutina semanal de ejercicios. Intente realizar 20minutos de este tipo de ejercicios al [employer] a la [place].  No consuma ningn producto que contenga nicotina o tabaco, como cigarrillos y Psychologist, sport and exercise. Si necesita ayuda para dejar de fumar, consulte al [employer].  Controle las enfermedades a largo plazo (crnicas), como el colesterol alto o la diabetes. Control  Contrlese  la presin arterial en su casa segn las indicaciones del mdico. La presin arterial deseada puede variar en funcin de las enfermedades, la edad y otros factores personales.  Contrlese la presin arterial de manera regular, en la frecuencia indicada por su mdico. Trabaje con su mdico  Revise con su mdico todos los medicamentos que toma ya que puede haber efectos secundarios o interacciones.  Hable con su mdico acerca de la dieta, hbitos de ejercicio y otros factores del estilo de vida que pueden contribuir a la hipertensin.  Consulte a su mdico regularmente. Su mdico puede ayudarle a crear y [place] su plan para controlar la hipertensin. Debo tomar un medicamento para controlar mi presin arterial? El mdico puede recetarle medicamentos si los cambios en el estilo de vida no son suficientes para Child psychotherapist la presin arterial y si:  Su presin arterial sistlica es de 315 o ms.  Su presin arterial diastlica es de 80 o ms.  Tome los medicamentos solamente como se lo haya indicado el mdico. Siga cuidadosamente las indicaciones. Los medicamentos para la presin arterial deben tomarse segn las indicaciones. Los medicamentos pierden eficacia al omitir las dosis. El hecho de omitir las dosis tambin [place] el riesgo de otros problemas. Comunquese con un mdico si:  Piensa que tiene Nurse, mental health a los medicamentos que ha tomado.  Tiene dolores de cabeza frecuentes (recurrentes).  Siente mareos.  Tiene  hinchazn en los tobillos.  Tiene problemas de visin. Solicite ayuda de inmediato si:  Siente un dolor de cabeza intenso o confusin.  Siente debilidad inusual, adormecimiento o que Geneticist, molecular.  Siente un dolor intenso en el pecho o el abdomen.  Vomita repetidas veces.  Tiene dificultad para respirar. Resumen  La hipertensin se produce cuando la sangre bombea en las arterias con mucha fuerza. Si esta afeccin no se controla, podra correr riesgo  de tener complicaciones graves.  La presin arterial deseada puede variar en funcin de las enfermedades, la edad y otros factores personales. Para la [employer], una presin arterial normal es menor que 120/80.  La hipertensin se puede controlar mediante cambios en el estilo de vida, tomando medicamentos, o ambas cosas. Los McDonald's Corporation estilo de vida incluyen prdida de peso, ingerir alimentos sanos, seguir una dieta baja en sodio, hacer ms ejercicio y Environmental consultant consumo de alcohol. Esta informacin no tiene Marine scientist el consejo del mdico. Asegrese de hacerle al mdico cualquier pregunta que tenga. Document Released: 12/05/2011 Document Revised: 02/22/2016 Document Reviewed: 02/22/2016 Elsevier Interactive Patient Education  [employer].

## 2017-12-11 LAB — COMPREHENSIVE METABOLIC PANEL
ALT: 20 IU/L (ref 0–32)
AST: 23 IU/L (ref 0–40)
Albumin/Globulin Ratio: 1.5 (ref 1.2–2.2)
Albumin: 4.4 g/dL (ref 3.5–4.8)
Alkaline Phosphatase: 84 IU/L (ref 39–117)
BUN/Creatinine Ratio: 20 (ref 12–28)
BUN: 19 mg/dL (ref 8–27)
Bilirubin Total: 0.7 mg/dL (ref 0.0–1.2)
CO2: 26 mmol/L (ref 20–29)
Calcium: 10.2 mg/dL (ref 8.7–10.3)
Chloride: 97 mmol/L (ref 96–106)
Creatinine, Ser: 0.96 mg/dL (ref 0.57–1.00)
GFR calc Af Amer: 66 mL/min/{1.73_m2} (ref >59)
GFR calc non Af Amer: 57 mL/min/{1.73_m2} — ABNORMAL LOW (ref >59)
Globulin, Total: 2.9 g/dL (ref 1.5–4.5)
Glucose: 104 mg/dL — ABNORMAL HIGH (ref 65–99)
Potassium: 4.5 mmol/L (ref 3.5–5.2)
Sodium: 139 mmol/L (ref 134–144)
Total Protein: 7.3 g/dL (ref 6.0–8.5)

## 2017-12-11 LAB — CBC
Hematocrit: 40.6 % (ref 34.0–46.6)
Hemoglobin: 13.5 g/dL (ref 11.1–15.9)
MCH: 31.3 pg (ref 26.6–33.0)
MCHC: 33.3 g/dL (ref 31.5–35.7)
MCV: 94 fL (ref 79–97)
Platelets: 217 10*3/uL (ref 150–450)
RBC: 4.32 x10E6/uL (ref 3.77–5.28)
RDW: 13 % (ref 12.3–15.4)
WBC: 5.4 10*3/uL (ref 3.4–10.8)

## 2017-12-11 LAB — LIPID PANEL
Chol/HDL Ratio: 3.3 ratio (ref 0.0–4.4)
Cholesterol, Total: 206 mg/dL — ABNORMAL HIGH (ref 100–199)
HDL: 62 mg/dL (ref >39)
LDL Calculated: 117 mg/dL — ABNORMAL HIGH (ref 0–99)
Triglycerides: 136 mg/dL (ref 0–149)
VLDL Cholesterol Cal: 27 mg/dL (ref 5–40)

## 2017-12-11 LAB — TSH: TSH: 2.1 u[IU]/mL (ref 0.450–4.500)

## 2017-12-11 LAB — MICROALBUMIN / CREATININE URINE RATIO
Creatinine, Urine: 13.9 mg/dL
Microalb/Creat Ratio: <21.6 mg/g creat (ref 0.0–30.0)
Microalbumin, Urine: <3 ug/mL

## 2017-12-31 ENCOUNTER — Telehealth: Payer: Self-pay | Admitting: Family Medicine

## 2017-12-31 NOTE — Telephone Encounter (Signed)
Copied from [place] [id number]. Topic: General - Other >> Dec 31, 2017 11:15 AM Janace Aris A wrote: Reason for CRM: patient called in wanting to know if her results were ready, because she has not received a call to go over them yet. Also she had questions about a medication she was prescribed by her PCP Dr. Pamella Pert. Unfortunately her accent was very thick and I could not understand the medication name. I had the patient spell the name of  the med, and it did not match what she had on her medication list. She was able to give me a prescription number ([id number]). Overall she wanted to know why she was prescribed these and what she should take them for.   Please advise

## 2018-01-01 NOTE — Telephone Encounter (Signed)
Patient calling to check the status of her call back. States that she waited all day yesterday for a call back and did not received a call. Would like a call to discuss results and medications. Please advise. Patient very persistent that she receive the call today.

## 2018-01-02 NOTE — Telephone Encounter (Signed)
Message sent to Dr. Pamella Pert. Pt req call back to discuss  Results & meds.

## 2018-01-03 NOTE — Telephone Encounter (Signed)
Needs spanish interpreter  Please let patient know that her labs are fine Her diabetes is very well controlled, her A1c 7.1 No anemia normal electrolytes, kidney, liver and thyroid function Neg urine micro/crt (which is a good thing) Her cholesterol is fine Also last visit her BP was elevated so I stopped her hydrochlorothiazide and started chorthalidone. She is to continue with amlodipine and losartan.  Thank you

## 2018-01-06 ENCOUNTER — Encounter: Payer: Self-pay | Admitting: *Deleted

## 2018-01-06 NOTE — Telephone Encounter (Signed)
Letter sent.

## 2018-01-15 ENCOUNTER — Other Ambulatory Visit: Payer: Self-pay | Admitting: Family Medicine

## 2018-01-15 DIAGNOSIS — M810 Age-related osteoporosis without current pathological fracture: Secondary | ICD-10-CM

## 2018-01-15 DIAGNOSIS — Z1231 Encounter for screening mammogram for malignant neoplasm of breast: Secondary | ICD-10-CM

## 2018-01-15 NOTE — Telephone Encounter (Signed)
Copied from [place] [id number]. Topic: Quick Communication - Rx Refill/Question >> Jan 15, 2018 11:56 AM Sheppard Coil, [employer] L wrote: Medication:   alendronate (FOSAMAX) 70 MG tablet   amLODipine (NORVASC) 10 MG tablet   aspirin 81 MG tablet   blood glucose meter kit and supplies KIT   Calcium Carb-Cholecalciferol (CALCIUM-VITAMIN D) 500-200 MG-UNIT tablet   chlorthalidone (HYGROTON) 25 MG tablet   insulin aspart (NOVOLOG) 100 UNIT/ML FlexPen   Insulin Glargine (LANTUS SOLOSTAR) 100 UNIT/ML Solostar Pen   Insulin Pen Needle (PEN NEEDLES 5/16") 30G X 8 MM MISC   losartan (COZAAR) 100 MG tablet   metFORMIN (GLUCOPHAGE) 1000 MG tablet   Multiple Vitamin (MULTIVITAMIN) capsule     Has the patient contacted their pharmacy? Yes - states [place] called and told them all medications needed to be renewed and that had to come from primary to pharmacy (Agent: If no, request that the patient contact the pharmacy for the refill.) (Agent: If yes, when and what did the pharmacy advise?)  Preferred Pharmacy (with phone number or street name): [facility], [place] [address] Phone: [phone] Fax: [phone]  Agent: Please be advised that RX refills may take up to 3 business days. We ask that you follow-up with your pharmacy.

## 2018-01-15 NOTE — Telephone Encounter (Signed)
Requested medication (s) are due for refill today: Yes  Requested medication (s) are on the active medication list: Yes  Last refill:  n/a  Future visit scheduled: No  Notes to clinic: [facility] requesting these refills come from primary to them    Requested Prescriptions  Pending Prescriptions Disp Refills   amLODipine (NORVASC) 10 MG tablet 90 tablet 0    Sig: Take 1 tablet (10 mg total) by mouth daily.     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, macular edema presence unspecified, unspecified laterality, unspecified retinopathy severity ([place])   [facility] at [facility], Lilia Argue, MD   4 months ago Uncontrolled type 2 diabetes mellitus without complication, with long-term current use of insulin [facility])   [facility] at [facility], Lilia Argue, MD   6 months ago URI with cough and congestion   Primary Care at [facility], Lilia Argue, MD   7 months ago Uncontrolled type 2 diabetes mellitus without complication, with long-term current use of insulin [facility])   [facility] at [facility], Lilia Argue, MD   10 months ago Uncontrolled type 2 diabetes mellitus without complication, with long-term current use of insulin [facility])   [facility] at [facility], Lilia Argue, MD      Future Appointments            In 1 month Rutherford Guys, MD Primary Care at [facility], [facility]          aspirin 81 MG tablet 30 tablet 0    Sig: Take 1 tablet (81 mg total) by mouth every morning.     Analgesics:  NSAIDS - aspirin Passed - 01/15/2018 12:29 PM      Passed - Patient is not pregnant      Passed - Valid encounter within last 12 months    Recent Outpatient Visits          1 month ago Controlled type 1  diabetes mellitus with retinopathy, macular edema presence unspecified, unspecified laterality, unspecified retinopathy severity ([place])   Primary Care at [facility], Lilia Argue, MD   4 months ago Uncontrolled type 2 diabetes mellitus without complication, with long-term current use of insulin [facility])   [facility] at [facility], Lilia Argue, MD   6 months ago URI with cough and congestion   Primary Care at [facility], Lilia Argue, MD   7 months ago Uncontrolled type 2 diabetes mellitus without complication, with long-term current use of insulin [facility])   [facility] at [facility], Lilia Argue, MD   10 months ago Uncontrolled type 2 diabetes mellitus without complication, with long-term current use of insulin [facility])   [facility] at [facility], Lilia Argue, MD      Future Appointments            In 1 month Rutherford Guys, MD Primary Care at [place], [facility]          blood glucose meter kit and supplies KIT 1 each 0    Sig: Dispense based on patient and insurance preference. Check blood glucose three times a  day. 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, macular edema presence unspecified, unspecified laterality, unspecified retinopathy severity ([place])   Primary Care at [facility], Lilia Argue, MD   4 months ago Uncontrolled type 2 diabetes mellitus without complication, with long-term current use of insulin [facility])   [facility] at [facility], Lilia Argue, MD   6 months ago URI with cough and congestion   Primary Care at [facility], Lilia Argue, MD   7 months ago Uncontrolled type 2 diabetes mellitus without complication, with long-term current use of insulin [facility])   [facility] at [facility], Lilia Argue, MD   10 months ago Uncontrolled type 2 diabetes mellitus without complication,  with long-term current use of insulin [facility])   [facility] at [facility], Lilia Argue, MD      Future Appointments            In 1 month Rutherford Guys, MD Primary Care at [facility], [facility]          Calcium Carb-Cholecalciferol (CALCIUM-VITAMIN D) 500-200 MG-UNIT tablet 90 tablet 0    Sig: Take 1 tablet by mouth every morning.     Endocrinology: Minerals - Calcium Supplementation with Vitamin D Failed - 01/15/2018 12:29 PM      Failed - Phosphate in normal range and within 360 days    No results found for: PHOS       Failed - Vit D in normal range and within 360 days    No results found for: [id number], [id number], [id number], 25OHVITD3, 25OHVITD2, 25OHVITD3, 25OHVITD2, 25OHVITD1, 25OHVITD2, 25OHVITD3, VD25OH       Passed - Ca in normal range and within 360 days    Calcium  Date Value Ref Range Status  12/10/2017 10.2 8.7 - 10.3 mg/dL Final  04/27/2016 10.5 (H) 8.4 - 10.4 mg/dL Final         Passed - Valid encounter within last 12 months    Recent Outpatient Visits          1 month ago 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, macular edema presence unspecified, unspecified laterality, unspecified retinopathy severity ([place])   [facility] at [facility], Lilia Argue, MD   4 months ago Uncontrolled type 2 diabetes mellitus without complication, with long-term current use of insulin [facility])   [facility] at [facility], Lilia Argue, MD   6 months ago URI with cough and congestion   Primary Care at [facility], Lilia Argue, MD   7 months ago Uncontrolled type 2 diabetes mellitus without complication, with long-term current use of insulin [facility])   [facility] at [facility], Lilia Argue, MD   10 months ago Uncontrolled type 2 diabetes mellitus without complication, with long-term current use of insulin [facility])   [facility] at [facility], Lilia Argue, MD      Future Appointments            In 1 month Rutherford Guys, MD Primary Care at [place], [facility]          alendronate  (FOSAMAX) 70 MG tablet 12 tablet 1    Sig: Take 1 tablet (70 mg total) by mouth every 7 (seven) days. Take with a full glass of  water on an empty stomach.     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)   [facility] at [facility], Lilia Argue, MD   4 months ago Uncontrolled type 2 diabetes mellitus without complication, with long-term current use of insulin [facility])   [facility] at [facility], Lilia Argue, MD   6 months ago URI with cough and congestion   Primary Care at [facility], Lilia Argue, MD   7 months ago Uncontrolled type 2 diabetes mellitus without complication, with long-term current use of insulin [facility])   [facility] at [facility], Lilia Argue, MD   10 months ago Uncontrolled type 2 diabetes mellitus without complication, with long-term current use of insulin [facility])   [facility] at [facility], Lilia Argue, MD      Future Appointments            In 1 month Rutherford Guys, MD Primary Care at [place], [facility]          chlorthalidone (HYGROTON) 25 MG tablet 90 tablet 0    Sig: Take 1 tablet (25 mg total) by mouth daily.     Cardiovascular: Diuretics - Thiazide Failed - 01/15/2018 12:29 PM      Failed - Last BP in normal range    BP Readings from Last 1 Encounters:  12/10/17 (!) 154/66         Passed - Ca in normal range  and within 360 days    Calcium  Date Value Ref Range Status  12/10/2017 10.2 8.7 - 10.3 mg/dL Final  04/27/2016 10.5 (H) 8.4 - 10.4 mg/dL Final         Passed - Cr in normal range and within 360 days    Creatinine  Date Value Ref Range Status  04/27/2016 0.9 0.6 - 1.1 mg/dL Final   Creatinine, Ser  Date Value Ref Range Status  12/10/2017 0.96 0.57 - 1.00 mg/dL Final         Passed - K in normal range and within 360 days    Potassium  Date Value Ref Range Status  12/10/2017 4.5 3.5 - 5.2 mmol/L Final  04/27/2016 4.6 3.5 - 5.1 mEq/L Final         Passed - Na in normal range and within 360 days    Sodium  Date Value Ref Range Status  12/10/2017 139 134 - 144 mmol/L Final  04/27/2016 138 136 - 145 mEq/L Final         Passed - Valid encounter within last 6 months    Recent Outpatient Visits          1 month ago 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, macular edema presence unspecified, unspecified laterality, unspecified retinopathy severity ([place])  [facility], Lilia Argue, MD   4 months ago Uncontrolled type 2 diabetes mellitus without complication, with long-term current use of insulin [facility])   [facility] at [facility], Lilia Argue, MD   6 months ago URI with cough and congestion   Primary Care at [facility], Lilia Argue, MD   7 months ago Uncontrolled type 2 diabetes mellitus without complication, with long-term current use of insulin [facility])   [facility] at [facility], Lilia Argue, MD   10 months ago Uncontrolled type 2 diabetes mellitus without complication, with long-term current use of insulin [facility])   [facility] at [facility], Lilia Argue, MD      Future Appointments            In 1 month Rutherford Guys, MD Primary Care at [place], [facility]          insulin aspart (NOVOLOG) 100 UNIT/ML FlexPen 5 pen 5    Sig: Inject 8 Units into the skin 3 (three) times daily with meals.     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, macular edema presence unspecified, unspecified laterality, unspecified retinopathy severity ([place])   [facility] at [facility], Lilia Argue, MD   4 months ago Uncontrolled type 2 diabetes mellitus without complication, with long-term current use of insulin [facility])   [facility] at [facility], Lilia Argue, MD   6 months ago URI with cough and congestion   [facility] at [facility], Lilia Argue, MD   7 months ago Uncontrolled type 2 diabetes mellitus without complication, with long-term current use of insulin ([place])   [facility] at [facility], Lilia Argue, MD   10 months ago Uncontrolled type 2 diabetes mellitus without complication, with long-term current use of insulin [facility])   Primary [facility] at [facility], Lilia Argue, MD      Future Appointments            In 1 month Rutherford Guys, MD Primary Care at [place], [facility]          Insulin Glargine (LANTUS SOLOSTAR) 100 UNIT/ML Solostar Pen 5 pen 5    Sig: Inject 22 Units into the skin daily at 10 pm.     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, macular edema presence unspecified, unspecified laterality, unspecified retinopathy severity ([place])   [facility] at [facility], Lilia Argue, MD   4 months ago Uncontrolled type 2 diabetes mellitus without complication, with long-term current use of insulin [facility])   [facility] at [facility], Lilia Argue, MD   6 months ago URI with cough and congestion   Primary Care at [facility], Lilia Argue, MD   7 months ago Uncontrolled type 2 diabetes mellitus without complication, with long-term current use of insulin [facility])   [facility] at [facility], Lilia Argue, MD   10 months ago Uncontrolled type 2 diabetes mellitus without complication, with long-term current use of insulin [facility])   [facility] at [facility], Lilia Argue, MD      Future Appointments            In 1 month Rutherford Guys, MD Primary Care at [facility], [facility]          Insulin Pen Needle (PEN NEEDLES 5/16") 30G X 8 MM MISC 360 each 1    Sig: 1 each by Does not apply route 4 (four) times daily -  before meals and at bedtime.     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, macular edema presence unspecified, unspecified laterality, unspecified retinopathy severity ([place])   Primary Care at [facility] Lilia Argue, MD   4 months ago Uncontrolled type 2 diabetes mellitus without complication, with long-term current use of insulin [facility])   [facility] at [facility], Lilia Argue, MD   6 months ago URI with cough and congestion   Primary Care at [facility], Lilia Argue, MD   7 months ago Uncontrolled type 2 diabetes mellitus without complication, with long-term current use of  insulin [facility])   [facility] at [facility], Lilia Argue, MD   10 months ago Uncontrolled type 2 diabetes mellitus without complication, with long-term current use of insulin [facility])   [facility] at [facility], Lilia Argue, MD      Future Appointments            In 1 month Rutherford Guys, MD Primary Care at [place], [facility]          losartan (COZAAR) 100 MG tablet 90 tablet 0    Sig: Take 1 tablet (100 mg total) by mouth daily.     Cardiovascular:  Angiotensin Receptor Blockers Failed - 01/15/2018 12:29 PM      Failed - Last BP in normal range    BP Readings from Last 1 Encounters:  12/10/17 (!) 154/66         Passed - Cr in normal range and within 180 days    Creatinine  Date Value Ref  Range Status  04/27/2016 0.9 0.6 - 1.1 mg/dL Final   Creatinine, Ser  Date Value Ref Range Status  12/10/2017 0.96 0.57 - 1.00 mg/dL Final         Passed - K in normal range and within 180 days    Potassium  Date Value Ref Range Status  12/10/2017 4.5 3.5 - 5.2 mmol/L Final  04/27/2016 4.6 3.5 - 5.1 mEq/L Final         Passed - Patient is not pregnant      Passed - Valid encounter within last 6 months    Recent Outpatient Visits          1 month ago 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, macular edema presence unspecified, unspecified laterality, unspecified retinopathy severity ([place])   [facility] at [facility], Lilia Argue, MD   4 months ago Uncontrolled type 2 diabetes mellitus without complication, with long-term current use of insulin ([place])   [facility] Care at [facility], Lilia Argue, MD   6 months ago URI with cough and congestion   [facility] at [facility], Lilia Argue, MD   7 months ago Uncontrolled type 2 diabetes mellitus without complication, with long-term current use of insulin ([place])   [facility] at [facility], Lilia Argue, MD   10 months ago Uncontrolled type 2 diabetes mellitus without complication, with long-term current use of insulin [facility])   Primary  Care at [facility], Lilia Argue, MD      Future Appointments            In 1 month Rutherford Guys, MD Primary Care at [place], [facility]          metFORMIN (GLUCOPHAGE) 1000 MG tablet 180 tablet 1    Sig: Take 1 tablet (1,000 mg total) by mouth 2 (two) times daily with a meal.     Endocrinology:  Diabetes - Biguanides Passed - 01/15/2018 12:29 PM      Passed - Cr in normal range and within 360 days    Creatinine  Date Value Ref Range Status  04/27/2016 0.9 0.6 - 1.1 mg/dL Final   Creatinine, Ser  Date Value Ref Range Status  12/10/2017 0.96 0.57 - 1.00 mg/dL Final         Passed - HBA1C is between 0 and 7.9 and within 180 days    Hemoglobin A1C  Date Value Ref Range Status  12/10/2017 7.1 (A) 4.0 - 5.6 % Final   Hgb A1c MFr Bld  Date Value Ref Range Status  02/28/2017 9.0 (H) 4.8 - 5.6 % Final    Comment:             Prediabetes: 5.7 - 6.4          Diabetes: >6.4          Glycemic control for adults with diabetes: <7.0          Passed - eGFR in normal range and within 360 days    GFR calc Af Amer  Date Value Ref Range Status  12/10/2017 66 >59 mL/min/1.73 Final   GFR calc non Af Amer  Date Value Ref Range Status  12/10/2017 57 (L) >59 mL/min/1.73 Final         Passed - Valid encounter within last 6 months    Recent Outpatient Visits          1 month ago 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, macular edema presence unspecified, unspecified laterality, unspecified retinopathy severity ([place])  [facility] at [facility], Lilia Argue, MD   4 months ago Uncontrolled type 2 diabetes mellitus without complication, with long-term current use of insulin [facility])   [facility] at [facility], Lilia Argue, MD   6 months ago URI with cough and congestion   Primary Care at [facility], Lilia Argue, MD   7 months ago Uncontrolled type 2 diabetes mellitus without complication, with long-term current use of insulin [facility])   [facility] at [facility], Lilia Argue, MD    10 months ago Uncontrolled type 2 diabetes mellitus without complication, with long-term current use of insulin [facility])   Primary [facility] at [facility], Lilia Argue, MD      Future Appointments            In 1 month Rutherford Guys, MD Primary Care at [place], [facility]          Multiple Vitamin (MULTIVITAMIN) capsule      Sig: Take 1 capsule by mouth every morning.     There is no refill protocol information for this order

## 2018-01-16 MED ORDER — MULTIVITAMINS PO CAPS: 1.0000 | ORAL_CAPSULE | Freq: Every morning | ORAL | 1 refills | Status: AC

## 2018-01-16 MED ORDER — ASPIRIN 81 MG PO TABS: 81.0000 mg | ORAL_TABLET | Freq: Every morning | ORAL | 0 refills | Status: AC

## 2018-01-16 MED ORDER — ALENDRONATE SODIUM 70 MG PO TABS: 70.0000 mg | ORAL_TABLET | ORAL | 1 refills | Status: AC

## 2018-01-16 MED ORDER — BLOOD GLUCOSE MONITOR KIT: PACK | 0 refills | Status: AC

## 2018-01-16 NOTE — Telephone Encounter (Signed)
Spoke to [place] at [facility] order. Unsure why 12 prescriptions have been sent for refill Reviewed each medication - all have refills available exceptASA, Blood glucose kit, multivitamin and Fosamax. Those have been sent in today.

## 2018-01-20 ENCOUNTER — Other Ambulatory Visit: Payer: Self-pay | Admitting: Family Medicine

## 2018-01-20 NOTE — Telephone Encounter (Signed)
Copied from [place] [id number]. Topic: Quick Communication - Rx Refill/Question >> Jan 20, 2018  2:14 PM Blase Mess A wrote: Medication: Insulin Pen Needle (PEN NEEDLES 5/16") 30G X 8 MM MISC [[id number]]   Has the patient contacted their pharmacy? Yes  (Agent: If no, request that the patient contact the pharmacy for the refill.) (Agent: If yes, when and what did the pharmacy advise?)  Preferred Pharmacy (with phone number or street name): [place], [address] [place] [address] Phone: [phone] Fax: [phone]    Agent: Please be advised that RX refills may take up to 3 business days. We ask that you follow-up with your pharmacy.

## 2018-01-21 MED ORDER — "PEN NEEDLES 5/16"" 30G X 8 MM MISC": 1.0000 | Freq: Three times a day (TID) | 1 refills | Status: AC

## 2018-01-21 NOTE — Telephone Encounter (Signed)
Requested Prescriptions  Pending Prescriptions Disp Refills  . Insulin Pen Needle (PEN NEEDLES 5/16") 30G X 8 MM MISC 360 each 1    Sig: 1 each by Does not apply route 4 (four) times daily -  before meals and at bedtime.     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, macular edema presence unspecified, unspecified laterality, unspecified retinopathy severity ([place])   Primary Care at [facility], Lilia Argue, MD   4 months ago Uncontrolled type 2 diabetes mellitus without complication, with long-term current use of insulin [facility])   [facility] at [facility], Lilia Argue, MD   7 months ago URI with cough and congestion   [facility] at [facility], Lilia Argue, MD   7 months ago Uncontrolled type 2 diabetes mellitus without complication, with long-term current use of insulin [facility])   [facility] at [facility], Lilia Argue, MD   10 months ago Uncontrolled type 2 diabetes mellitus without complication, with long-term current use of insulin [facility])   Primary [facility] at [facility], Lilia Argue, MD      Future Appointments            In 1 month Rutherford Guys, MD Primary Care at [place], [facility]

## 2018-01-21 NOTE — Telephone Encounter (Signed)
Requested Prescriptions  Pending Prescriptions Disp Refills  . 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, macular edema presence unspecified, unspecified laterality, unspecified retinopathy severity ([place])   Primary Care at [facility], Lilia Argue, MD   4 months ago Uncontrolled type 2 diabetes mellitus without complication, with long-term current use of insulin [facility])   [facility] at [facility], Lilia Argue, MD   7 months ago URI with cough and congestion   Primary Care at [facility], Lilia Argue, MD   7 months ago Uncontrolled type 2 diabetes mellitus without complication, with long-term current use of insulin [facility])   [facility] at [facility], Lilia Argue, MD   10 months ago Uncontrolled type 2 diabetes mellitus without complication, with long-term current use of insulin [facility])   Primary [facility] at [facility], Lilia Argue, MD      Future Appointments            In 1 month Rutherford Guys, MD Primary Care at [place], [facility]

## 2018-02-24 ENCOUNTER — Ambulatory Visit
Admission: RE | Admit: 2018-02-24 | Discharge: 2018-02-24 | Disposition: A | Discharge status: Home / Self Care | Payer: Medicare HMO | Source: Ambulatory Visit | Attending: Family Medicine | Admitting: Family Medicine

## 2018-02-24 ENCOUNTER — Ambulatory Visit: Payer: Medicare HMO

## 2018-02-24 DIAGNOSIS — Z1231 Encounter for screening mammogram for malignant neoplasm of breast: Secondary | ICD-10-CM

## 2018-03-07 ENCOUNTER — Other Ambulatory Visit: Payer: Self-pay | Admitting: Family Medicine

## 2018-03-07 DIAGNOSIS — M810 Age-related osteoporosis without current pathological fracture: Secondary | ICD-10-CM

## 2018-03-11 ENCOUNTER — Ambulatory Visit: Payer: Medicare HMO | Admitting: Family Medicine

## 2018-04-07 ENCOUNTER — Other Ambulatory Visit: Payer: Self-pay | Admitting: Family Medicine

## 2018-04-08 NOTE — Telephone Encounter (Signed)
Requested medication (s) are due for refill today: Yes  Requested medication (s) are on the active medication list: No  Last refill:  None on record  Future visit scheduled: No  Notes to clinic:  Lancets are not on medication list.    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, macular edema presence unspecified, unspecified laterality, unspecified retinopathy severity ([place])   Primary Care at [facility], Lilia Argue, MD   7 months ago Uncontrolled type 2 diabetes mellitus without complication, with long-term current use of insulin [facility])   [facility] at [facility], Lilia Argue, MD   9 months ago URI with cough and congestion   Primary Care at [facility], Lilia Argue, MD   10 months ago Uncontrolled type 2 diabetes mellitus without complication, with long-term current use of insulin [facility])   [facility] at [facility], Lilia Argue, MD   1 year ago Uncontrolled type 2 diabetes mellitus without complication, with long-term current use of insulin [facility])   [facility] at [facility], Lilia Argue, MD

## 2018-04-09 ENCOUNTER — Other Ambulatory Visit: Payer: Self-pay | Admitting: Family Medicine

## 2018-04-25 NOTE — Progress Notes (Signed)
Erika Campos   [phone] [phone]   Clinic Follow up Note   Patient Care Team: Rutherford Guys, MD as PCP - General (Family Medicine)  Date of Service:  04/28/2018  CHIEF COMPLAINT: F/u of Left breast cancer, stage IA  SUMMARY OF ONCOLOGIC HISTORY: A 3 mm invasive cancer involving the left breast, grade 1, status post partial mastectomy on 12/17/2007. Estrogen receptor was 84%, progesterone receptor 95%. HER-2/neu was negative by [facility]. Proliferative fraction was 9%. Left axillary lymph node dissection revealed that all 15 lymph nodes were negative. Tumor stage was T1a N0. Axillary dissection was carried out on 01/16/2008. 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 The patient received radiation treatments to the left breast. 6000 cGy in 30 fractions from 02/11/2008 through 03/29/2008. The patient finished 5 year adjuvant tamoxifen from April 13, 2008 to 07/31/2013  CURRENT THERAPY:  Surveillance   INTERVAL HISTORY:  Erika Campos is here for a follow up of breast cancer. She was last seen by me 1 year ago. She presents to the clinic today with her husband and [employer] interpreter. She notes a skin spot on her right breast which has been there for years. She wonders can she use her left arm for things post mastectomy because she had her blood drawn on that side She denies swelling of her left arm.  She notes she takes her HTN medication, Norvasc, Losartan. She notes her BP has been evaluated at home, recently this week. She notes she may be nervous lately because her daughter is in the hospital due to an infection.  She notes thoracic back pain for several months. She has used patches for her pain. The pain is 5/10. This pain is mainly brought on by picking up  objects and cleaning. No imaging was done for her back, but will ask for one by her PCP.    REVIEW OF SYSTEMS:   Constitutional: Denies fevers, chills or abnormal weight loss Eyes: Denies blurriness of vision Ears, nose, mouth, throat, and face: Denies mucositis or sore throat Respiratory: Denies cough, dyspnea or wheezes Cardiovascular: Denies palpitation, chest discomfort or lower extremity swelling Gastrointestinal:  Denies nausea, heartburn or change in bowel habits Skin: Denies abnormal skin rashes (+) Red spot on right breast  Lymphatics: Denies new lymphadenopathy or easy bruising MSK: (+) Thoracic back pain 5/10, intermittent  Neurological:Denies numbness, tingling or new weaknesses Behavioral/Psych: Mood is stable, no new changes  All other systems were reviewed with the patient and are negative.  MEDICAL HISTORY:  Past Medical History:  Diagnosis Date  . Cancer (HCC)    breast  . Diabetes mellitus   . Hyperlipidemia   . Hypertension   . Osteoporosis   . Retinopathy due to secondary diabetes mellitus ([place]) 10/24/2016   Dr Katy Fitch    SURGICAL HISTORY: Past Surgical History:  Procedure Laterality Date  . BREAST LUMPECTOMY Left   . BREAST SURGERY Left   . CYSTECTOMY     lt breast  . FRACTURE SURGERY      I have reviewed the social history and family history with the patient and they are unchanged from previous note.  ALLERGIES:  is allergic to epinephrine.  MEDICATIONS:  Current Outpatient Medications  Medication Sig Dispense Refill  . [place]  EN SANGRE DOS VECES AL DIA 200 each 11  . alendronate (FOSAMAX) 70 MG tablet Take 1 tablet (70 mg total) by mouth every 7 (seven) days. Take with a full glass of water on an empty stomach. 12 tablet 1  . amLODipine (NORVASC) 10 MG tablet Take 1 tablet (10 mg total) by mouth daily. 90 tablet 1  . aspirin 81 MG tablet Take 1 tablet (81 mg total) by mouth every morning. 90  tablet 0  . blood glucose meter kit and supplies KIT Dispense based on patient and insurance preference. Check blood glucose three times a day. Dx E11.9, z79.4 1 each 0  . Calcium Carb-Cholecalciferol (CALCIUM-VITAMIN D) 500-200 MG-UNIT tablet Take 1 tablet by mouth every morning. 90 tablet 3  . calcium-vitamin D (OSCAL WITH D) 500-200 MG-UNIT TABS tablet TOME 1 TABLETA CADA MANANA 90 tablet 2  . chlorthalidone (HYGROTON) 25 MG tablet Take 1 tablet (25 mg total) by mouth daily. 90 tablet 1  . insulin aspart (NOVOLOG) 100 UNIT/ML FlexPen Inject 8 Units into the skin 3 (three) times daily with meals. 5 pen 5  . Insulin Glargine (LANTUS SOLOSTAR) 100 UNIT/ML Solostar Pen Inject 22 Units into the skin daily at 10 pm. 5 pen 5  . Insulin Pen Needle (PEN NEEDLES 5/16") 30G X 8 MM MISC 1 each by Does not apply route 4 (four) times daily -  before meals and at bedtime. 360 each 1  . losartan (COZAAR) 100 MG tablet Take 1 tablet (100 mg total) by mouth daily. 90 tablet 3  . metFORMIN (GLUCOPHAGE) 1000 MG tablet Take 1 tablet (1,000 mg total) by mouth 2 (two) times daily with a meal. 180 tablet 1  . Multiple Vitamin (MULTIVITAMIN) capsule Take 1 capsule by mouth every morning. 90 capsule 1  . simvastatin (ZOCOR) 20 MG tablet Take 1 tablet (20 mg total) by mouth every evening. 90 tablet 1  . SURE COMFORT PEN NEEDLES 30G X 8 MM MISC USE PARA INYECTAR 4 VECES AL DIA ANTES DE LAS COMIDAS Y AL ACOSTARSE 400 each 1   No current facility-administered medications for this visit.     PHYSICAL EXAMINATION: ECOG PERFORMANCE STATUS: 1 - Symptomatic but completely ambulatory  Vitals:   04/28/18 0940 04/28/18 0947  BP: (!) 187/72 (!) 190/68  Pulse: (!) 55   Resp: 17   Temp: 98.2 F (36.8 C)   SpO2: 100%    Filed Weights   04/28/18 0940  Weight: 133 lb 1.6 oz (60.4 kg)    GENERAL:alert, no distress and comfortable SKIN: skin color, texture, turgor are normal, no rashes or significant lesions EYES: normal,  Conjunctiva are pink and non-injected, sclera clear OROPHARYNX:no exudate, no erythema and lips, buccal mucosa, and tongue normal  NECK: supple, thyroid normal size, non-tender, without nodularity LYMPH:  no palpable lymphadenopathy in the cervical, axillary or inguinal LUNGS: clear to auscultation and percussion with normal breathing effort HEART: regular rate & rhythm and no murmurs and no lower extremity edema ABDOMEN:abdomen soft, non-tender and normal bowel sounds Musculoskeletal:no cyanosis of digits and no clubbing  NEURO: alert & oriented x 3 with fluent speech, no focal motor/sensory deficits BREAST: S/p left lumpectomies: Surgical incisions healed well (+) 2 dark red skin spot at 3:00 and 10:00 position,  No palpable mass or adenopathy in either breasts, no adenopathy   LABORATORY DATA:  I have reviewed the data as listed CBC Latest Ref Rng & Units 04/28/2018 12/10/2017 06/21/2017  WBC 4.0 - 10.5 K/uL 4.4 5.4  5.0  Hemoglobin 12.0 - 15.0 g/dL 13.5 13.5 13.3  Hematocrit 36.0 - 46.0 % 40.4 40.6 41.0  Platelets 150 - 400 K/uL 178 217 -     CMP Latest Ref Rng & Units 04/28/2018 12/10/2017 04/26/2017  Glucose 70 - 99 mg/dL 181(H) 104(H) 214(H)  BUN 8 - 23 mg/dL _0 Creatinine 0.44 - 1.00 mg/dL 0.99 0.96 1.06  Sodium 135 - 145 mmol/L 136 139 137  Potassium 3.5 - 5.1 mmol/L 3.8 4.5 3.8  Chloride 98 - 111 mmol/L 98 97 97(L)  CO2 22 - 32 mmol/L _1 Calcium 8.9 - 10.3 mg/dL 10.1 10.2 10.1  Total Protein 6.5 - 8.1 g/dL 7.7 7.3 7.8  Total Bilirubin 0.3 - 1.2 mg/dL 0.8 0.7 0.7  Alkaline Phos 38 - 126 U/L 87 84 89  AST 15 - 41 U/L _2 ALT 0 - 44 U/L _3 RADIOGRAPHIC STUDIES: I have personally reviewed the radiological images as listed and agreed with the findings in the report. No results found.   ASSESSMENT & PLAN:  Erika Campos is a 78 y.o. female with   1. History of stage Ia left breast cancer, ER+/PR+/HER2- -She was initially diagnosed  with left breast DCIS in 2007. She was treated with lumpectomy and Roloxifene for 1 year. Unfortunately she had recurrence which was invasive in her left breast diagnosed in 2009. She was treated with partial mastectomy with LN dissection, radiation and 5 years of Tamoxifen.  -She is clinically doing well. Lab reviewed, her CBC WNL, and CMP is still pending.  Her physical exam and her 02/2018 mammogram were unremarkable. There is no clinical concern for recurrence. -Will continue breast cancer surveillance. We discussed that she still has risk for late cancer recurrence. Will continue yearly mammogram, next in 02/2019.  -F/u in 1 year    2. Osteoporosis  -01/2016 DEXA showed osteoporosis, T score -2.7, slightly worse than 2 years  -She is on Alendronate, tolerating well, will continue  -Continue calcium carbonate-Vitamin D.    -Next DEXA in 02/2019   3. DM and HTN -DM not very well controlled -She is on Insulin, Amlodipine, Losartan -BP at  190/68 today (04/28/18), she is asymptomatic, has been under stress lately, she says her blood pressure at home is around 140/80 -f/u with PCP  4 Mid-Thoracic Spine pain  -Exam showed no tenderness, and this is intermittent to pt. I suspect this is related to degenerative changes -I suggest she see her PCP about this for further work up, such as x-ray .    Follow-up. Lab and f/u in one year with Lacie  Mammogram and DEXA in 02/2019    No problem-specific Assessment & Plan notes found for this encounter.   Orders Placed This Encounter  Procedures  . DG Bone Density    Standing Status:   Future    Standing Expiration Date:   04/28/2019    Order Specific Question:   Reason for Exam (SYMPTOM  OR DIAGNOSIS REQUIRED)    Answer:   screening    Order Specific Question:   Preferred imaging location?    Answer:   [facility]  . MM Digital Screening    Standing Status:   Future    Standing Expiration Date:   04/28/2019    Order Specific  Question:   Reason for Exam (SYMPTOM  OR DIAGNOSIS REQUIRED)    Answer:   screening  Order Specific Question:   Preferred imaging location?    Answer:   [facility]   All questions were answered. The patient knows to call the clinic with any problems, questions or concerns. No barriers to learning was detected. I spent 15 minutes counseling the patient face to face. The total time spent in the appointment was 20 minutes and more than 50% was on counseling and review of test results     Truitt Merle, MD 04/28/2018   I, Joslyn Devon, am acting as scribe for Truitt Merle, MD.   I have reviewed the above documentation for accuracy and completeness, and I agree with the above.

## 2018-04-28 ENCOUNTER — Inpatient Hospital Stay: Payer: Medicare HMO

## 2018-04-28 ENCOUNTER — Inpatient Hospital Stay: Payer: Medicare HMO | Attending: Hematology | Admitting: Hematology

## 2018-04-28 ENCOUNTER — Encounter: Payer: Self-pay | Admitting: Hematology

## 2018-04-28 ENCOUNTER — Telehealth: Payer: Self-pay | Admitting: Hematology

## 2018-04-28 VITALS — BP 190/68 | HR 55 | Temp 98.2°F | Resp 17 | Ht 61.5 in | Wt 133.1 lb

## 2018-04-28 DIAGNOSIS — M810 Age-related osteoporosis without current pathological fracture: Secondary | ICD-10-CM | POA: Diagnosis not present

## 2018-04-28 DIAGNOSIS — I10 Essential (primary) hypertension: Secondary | ICD-10-CM | POA: Diagnosis not present

## 2018-04-28 DIAGNOSIS — Z79899 Other long term (current) drug therapy: Secondary | ICD-10-CM | POA: Diagnosis not present

## 2018-04-28 DIAGNOSIS — Z7982 Long term (current) use of aspirin: Secondary | ICD-10-CM | POA: Insufficient documentation

## 2018-04-28 DIAGNOSIS — Z853 Personal history of malignant neoplasm of breast: Secondary | ICD-10-CM | POA: Diagnosis not present

## 2018-04-28 DIAGNOSIS — Z7984 Long term (current) use of oral hypoglycemic drugs: Secondary | ICD-10-CM

## 2018-04-28 DIAGNOSIS — M546 Pain in thoracic spine: Secondary | ICD-10-CM | POA: Insufficient documentation

## 2018-04-28 DIAGNOSIS — E785 Hyperlipidemia, unspecified: Secondary | ICD-10-CM

## 2018-04-28 DIAGNOSIS — Z794 Long term (current) use of insulin: Secondary | ICD-10-CM | POA: Diagnosis not present

## 2018-04-28 DIAGNOSIS — E119 Type 2 diabetes mellitus without complications: Secondary | ICD-10-CM | POA: Insufficient documentation

## 2018-04-28 LAB — COMPREHENSIVE METABOLIC PANEL
ALK PHOS: 87 U/L (ref 38–126)
ALT: 20 U/L (ref 0–44)
AST: 19 U/L (ref 15–41)
Albumin: 4.1 g/dL (ref 3.5–5.0)
Anion gap: 8 (ref 5–15)
BUN: 16 mg/dL (ref 8–23)
CALCIUM: 10.1 mg/dL (ref 8.9–10.3)
CHLORIDE: 98 mmol/L (ref 98–111)
CO2: 30 mmol/L (ref 22–32)
Creatinine, Ser: 0.99 mg/dL (ref 0.44–1.00)
GFR calc non Af Amer: 54 mL/min — ABNORMAL LOW (ref >60)
Glucose, Bld: 181 mg/dL — ABNORMAL HIGH (ref 70–99)
Potassium: 3.8 mmol/L (ref 3.5–5.1)
SODIUM: 136 mmol/L (ref 135–145)
Total Bilirubin: 0.8 mg/dL (ref 0.3–1.2)
Total Protein: 7.7 g/dL (ref 6.5–8.1)

## 2018-04-28 LAB — CBC WITH DIFFERENTIAL/PLATELET
Abs Immature Granulocytes: 0 10*3/uL (ref 0.00–0.07)
Basophils Absolute: 0 10*3/uL (ref 0.0–0.1)
Basophils Relative: 1 %
Eosinophils Absolute: 0.2 10*3/uL (ref 0.0–0.5)
Eosinophils Relative: 4 %
HCT: 40.4 % (ref 36.0–46.0)
Hemoglobin: 13.5 g/dL (ref 12.0–15.0)
Immature Granulocytes: 0 %
Lymphocytes Relative: 35 %
Lymphs Abs: 1.5 10*3/uL (ref 0.7–4.0)
MCH: 31.6 pg (ref 26.0–34.0)
MCHC: 33.4 g/dL (ref 30.0–36.0)
MCV: 94.6 fL (ref 80.0–100.0)
MONO ABS: 0.4 10*3/uL (ref 0.1–1.0)
Monocytes Relative: 8 %
Neutro Abs: 2.3 10*3/uL (ref 1.7–7.7)
Neutrophils Relative %: 52 %
Platelets: 178 10*3/uL (ref 150–400)
RBC: 4.27 MIL/uL (ref 3.87–5.11)
RDW: 11.9 % (ref 11.5–15.5)
WBC: 4.4 10*3/uL (ref 4.0–10.5)
nRBC: 0 % (ref 0.0–0.2)

## 2018-04-28 NOTE — Telephone Encounter (Signed)
Scheduled appt per 02/03 los. ° °Printed calendar and avs. °

## 2018-06-20 ENCOUNTER — Other Ambulatory Visit: Payer: Self-pay | Admitting: Family Medicine

## 2018-06-20 NOTE — Telephone Encounter (Signed)
Patient needs to schedule an appointment to be seen for any future refills

## 2019-03-17 ENCOUNTER — Inpatient Hospital Stay: Payer: Medicare HMO

## 2019-03-17 ENCOUNTER — Telehealth: Payer: Self-pay | Admitting: Nurse Practitioner

## 2019-03-17 ENCOUNTER — Inpatient Hospital Stay: Payer: Medicare HMO | Admitting: Nurse Practitioner

## 2019-03-17 NOTE — Telephone Encounter (Signed)
Per  12/21 sch message :  Scheduling Message Entered by Jonnie Finner D on 03/16/2019 at 2:41 PM Priority: High EST PT 30  Department: [facility]  Provider: Alla Feeling, NP  Appointment Notes:  please cancel this appoitnment and lab appointment for 12/22. Pt has moved to [place] and is re-established with new providers.

## 2019-05-22 ENCOUNTER — Other Ambulatory Visit: Payer: Self-pay | Admitting: Family Medicine

## 2019-05-22 NOTE — Telephone Encounter (Signed)
Requested medication (s) are due for refill today: Yes  Requested medication (s) are on the active medication list: Yes  Last refill:  04/14/18  Future visit scheduled: No  Notes to clinic:  Prescription has expired.    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, macular edema presence unspecified, unspecified laterality, unspecified retinopathy severity ([place])   [facility] at [facility], Lilia Argue, MD   1 year ago Uncontrolled type 2 diabetes mellitus without complication, with long-term current use of insulin [facility])   [facility] at [facility], Lilia Argue, MD   1 year ago URI with cough and congestion   [facility] at [facility], Lilia Argue, MD   1 year ago Uncontrolled type 2 diabetes mellitus without complication, with long-term current use of insulin [facility])   [facility] at [facility], Lilia Argue, MD   2 years ago Uncontrolled type 2 diabetes mellitus without complication, with long-term current use of insulin [facility])   [facility] at [facility], Lilia Argue, MD

## 2020-04-04 IMAGING — MG DIGITAL SCREENING BILATERAL MAMMOGRAM WITH TOMO AND CAD
8 series · 8 of 24 positions shown · non-contrast
Comparison: Previous exam(s).

CLINICAL DATA: Screening. History of treated left breast cancer,
status post lumpectomy in 4558.

EXAM:
DIGITAL SCREENING BILATERAL MAMMOGRAM WITH TOMO AND CAD

[L MLO synth-2D]
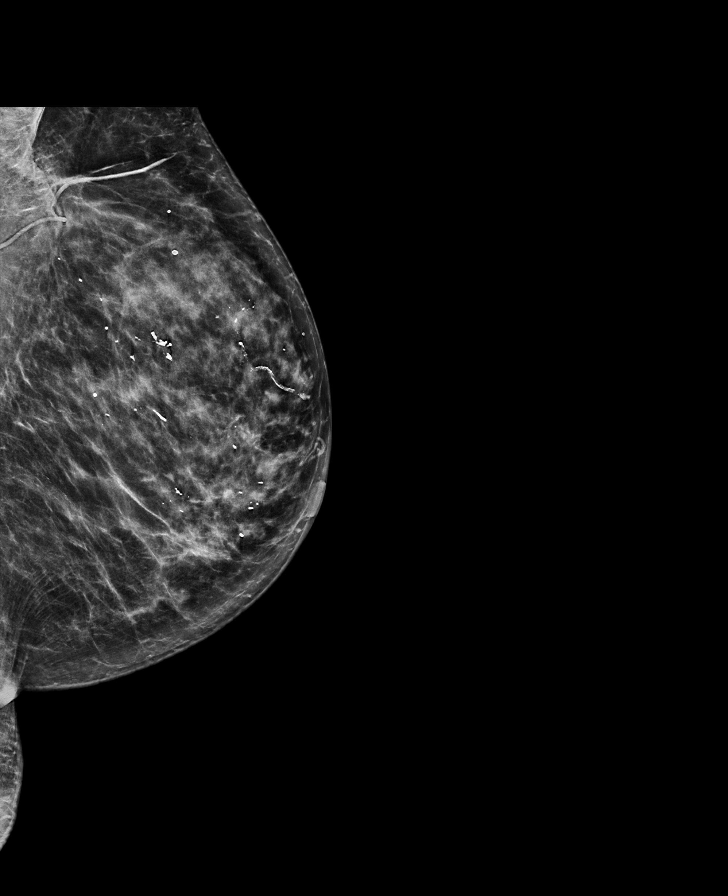

[R MLO synth-2D]
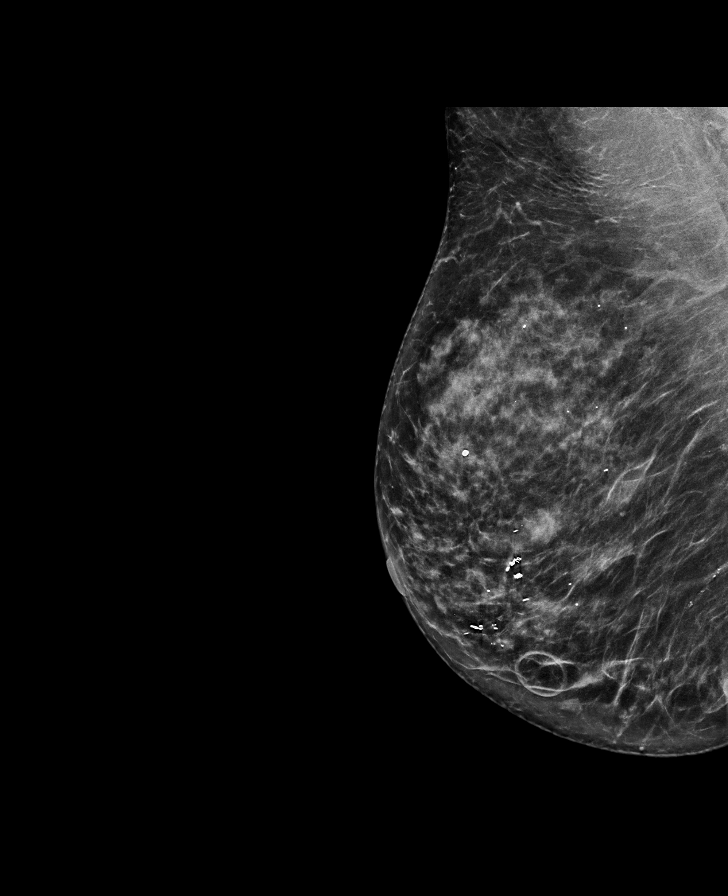

[R CC synth-2D]
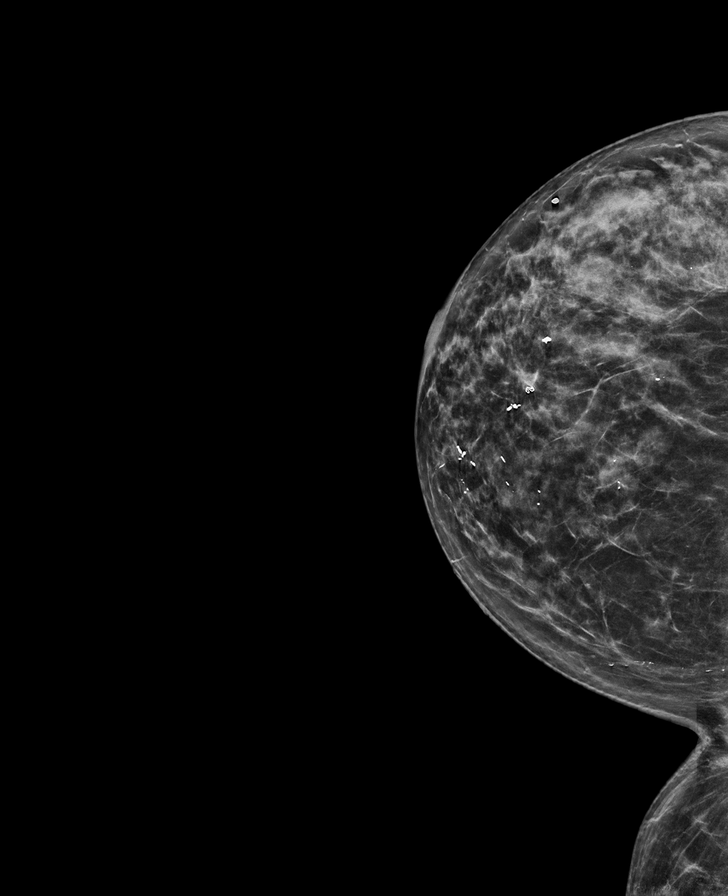

[L CC synth-2D]
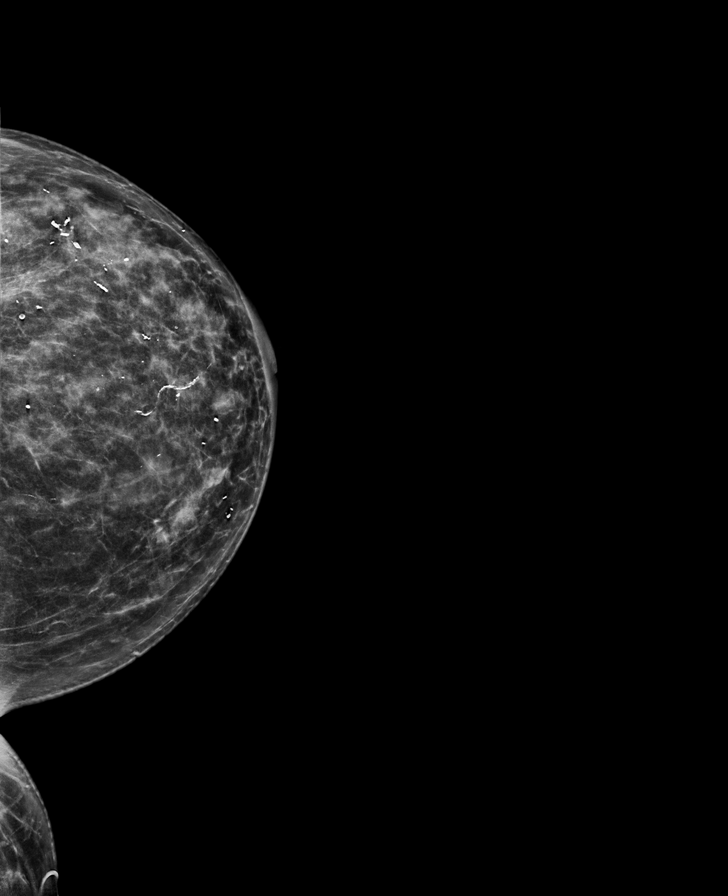

[L MLO tomo · tomo slice 37/74.0]
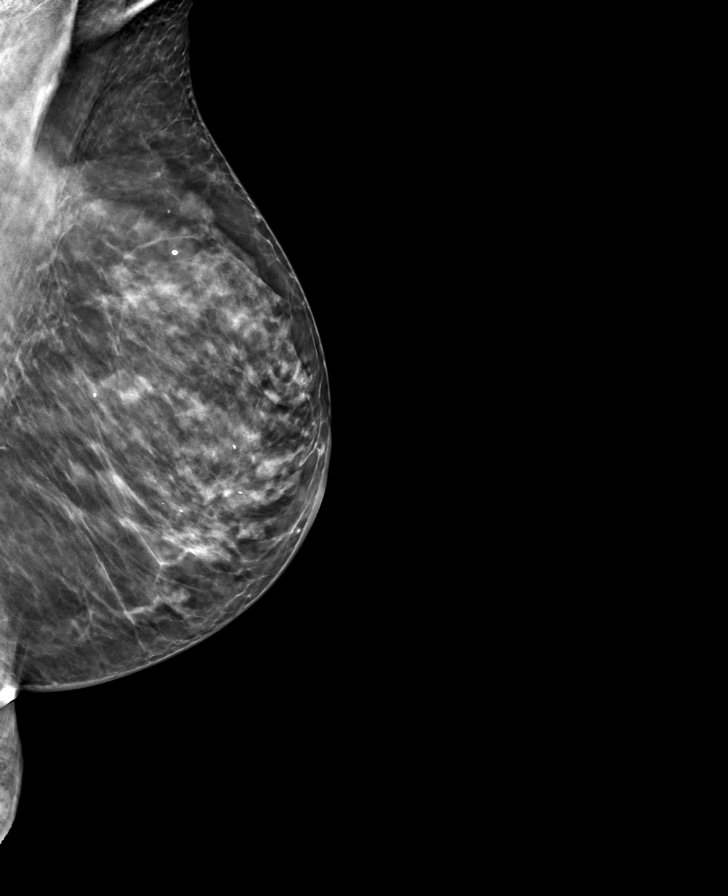

[R CC tomo · tomo slice 35/68.0]
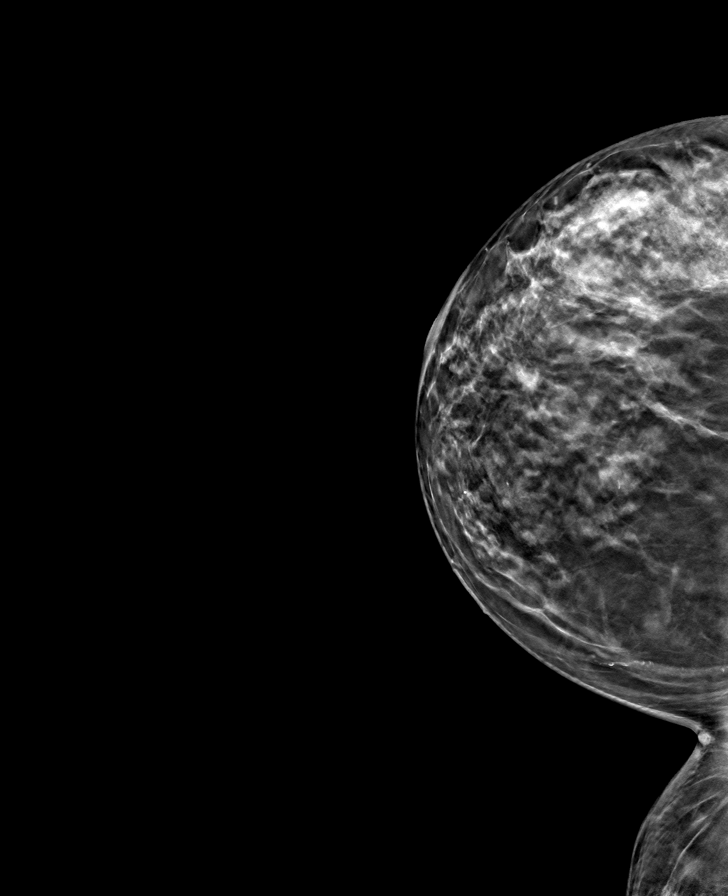

[L CC tomo · tomo slice 35/70.0]
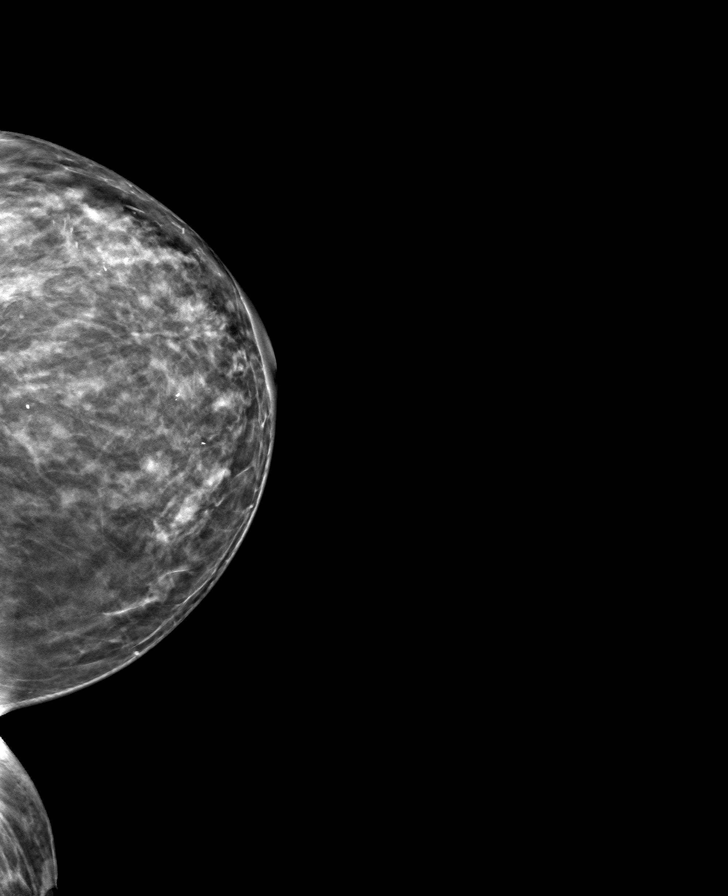

[R MLO tomo · tomo slice 37/74.0]
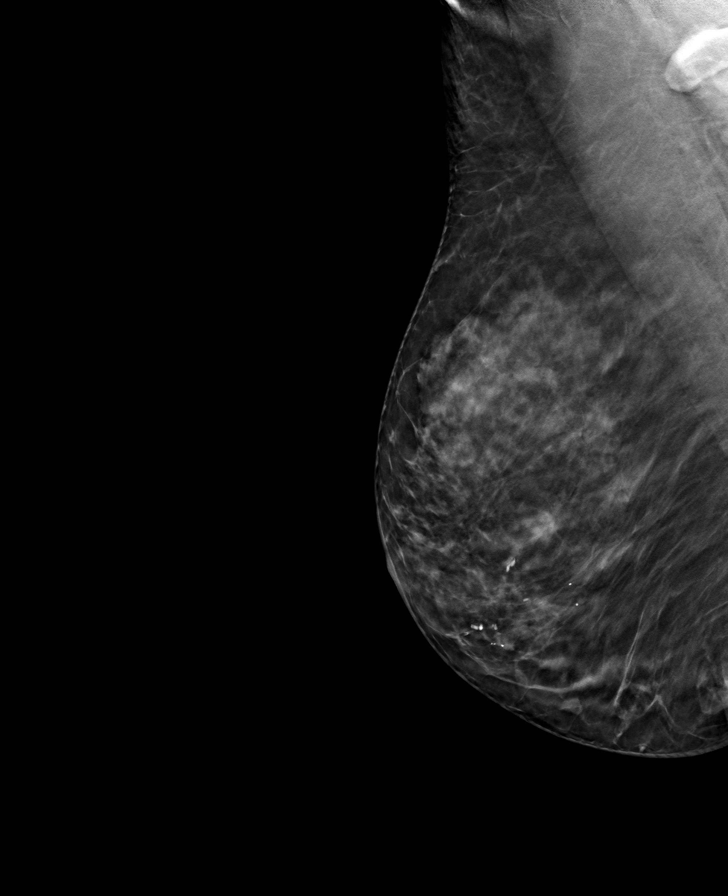

[8 of 24 positions shown; findings below may reference images not displayed]

ACR Breast Density Category c: The breast tissue is heterogeneously
dense, which may obscure small masses.
FINDINGS: There are no findings suspicious for malignancy. Images were
processed with CAD.
IMPRESSION: No mammographic evidence of malignancy. A result letter of this
screening mammogram will be mailed directly to the patient.

RECOMMENDATION:
Screening mammogram in one year. (Code:TL-[id number])

BI-RADS CATEGORY  1: Negative.
